# Patient Record
Sex: Female | Born: 1980 | Race: Black or African American | Hispanic: No | Smoking: Former smoker
Health system: Southern US, Community
[De-identification: ages and names within clinical notes are randomized; demographics above are authoritative.]

## PROBLEM LIST (undated history)

## (undated) DIAGNOSIS — Z8709 Personal history of other diseases of the respiratory system: Secondary | ICD-10-CM

## (undated) DIAGNOSIS — J45909 Unspecified asthma, uncomplicated: Secondary | ICD-10-CM

## (undated) DIAGNOSIS — Z8669 Personal history of other diseases of the nervous system and sense organs: Secondary | ICD-10-CM

## (undated) DIAGNOSIS — R569 Unspecified convulsions: Secondary | ICD-10-CM

## (undated) DIAGNOSIS — Z87442 Personal history of urinary calculi: Secondary | ICD-10-CM

## (undated) DIAGNOSIS — D259 Leiomyoma of uterus, unspecified: Secondary | ICD-10-CM

## (undated) DIAGNOSIS — N200 Calculus of kidney: Secondary | ICD-10-CM

## (undated) DIAGNOSIS — K5900 Constipation, unspecified: Secondary | ICD-10-CM

## (undated) HISTORY — PX: CHOLECYSTECTOMY: SHX55

---
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
## 2002-03-10 HISTORY — PX: CHOLECYSTECTOMY: SHX55

## 2003-11-29 ENCOUNTER — Emergency Department (HOSPITAL_COMMUNITY): Admission: EM | Admit: 2003-11-29 | Discharge: 2003-11-29 | Payer: Self-pay | Admitting: Emergency Medicine

## 2006-10-08 ENCOUNTER — Ambulatory Visit (HOSPITAL_COMMUNITY): Admission: RE | Admit: 2006-10-08 | Discharge: 2006-10-08 | Payer: Self-pay | Admitting: Family Medicine

## 2006-10-22 ENCOUNTER — Ambulatory Visit (HOSPITAL_COMMUNITY): Admission: RE | Admit: 2006-10-22 | Discharge: 2006-10-22 | Payer: Self-pay | Admitting: Family Medicine

## 2007-01-26 ENCOUNTER — Inpatient Hospital Stay (HOSPITAL_COMMUNITY): Admission: AD | Admit: 2007-01-26 | Discharge: 2007-01-26 | Payer: Self-pay | Admitting: Gynecology

## 2008-10-28 IMAGING — US US FETAL BPP W/O NONSTRESS
1 series · 14 of 25 positions shown · non-contrast
Comparison: none

OBSTETRICAL ULTRASOUND:

 This ultrasound exam was performed in the [HOSPITAL] Ultrasound Department.  The OB US report was generated in the AS system, and faxed to the ordering physician.  This report is also available in [REDACTED] PACS.

[Series 1: us fetal bpp w/o nonstress · 0.35mm/px · 25 acquisitions, 14 frames shown]
[im 1/25]
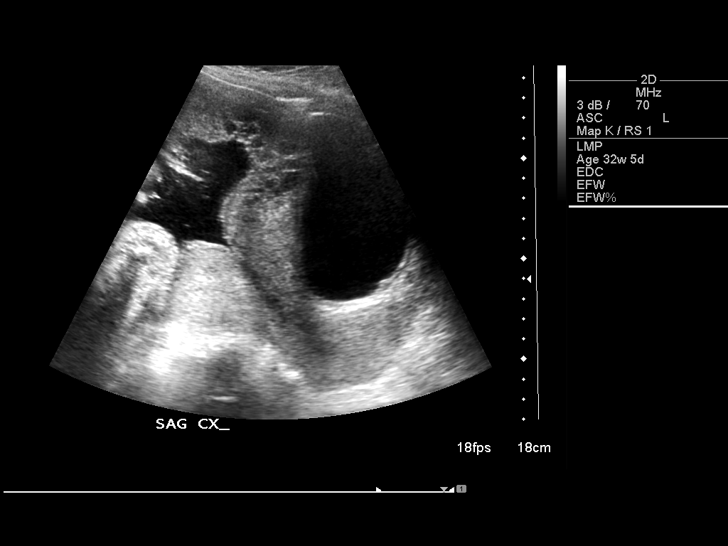
[im 3/25]
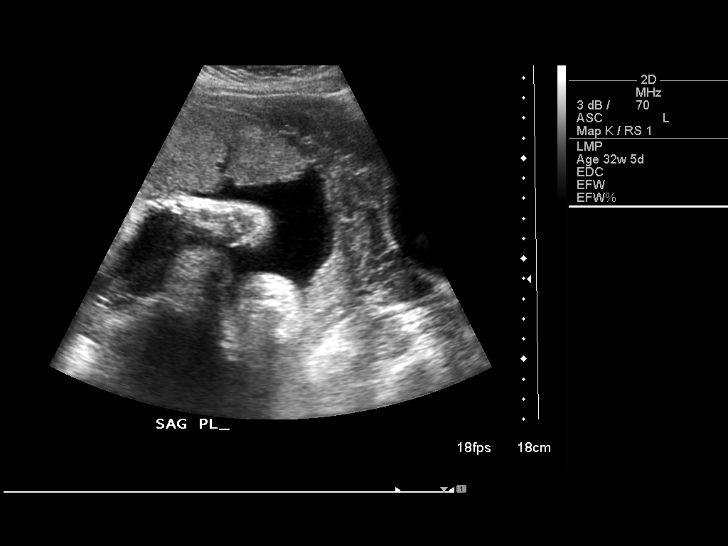
[im 5/25]
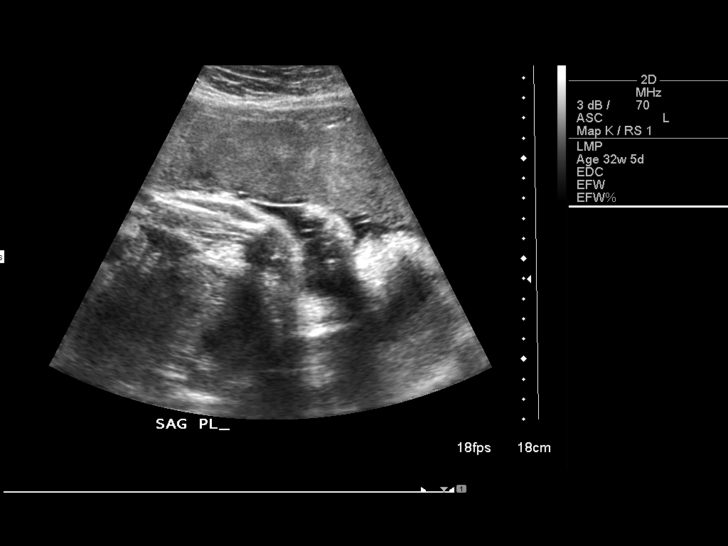
[im 7/25]
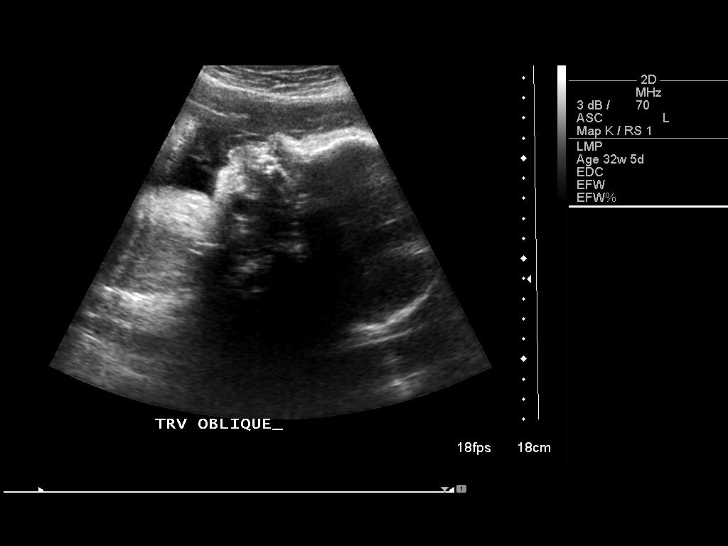
[im 9/25]
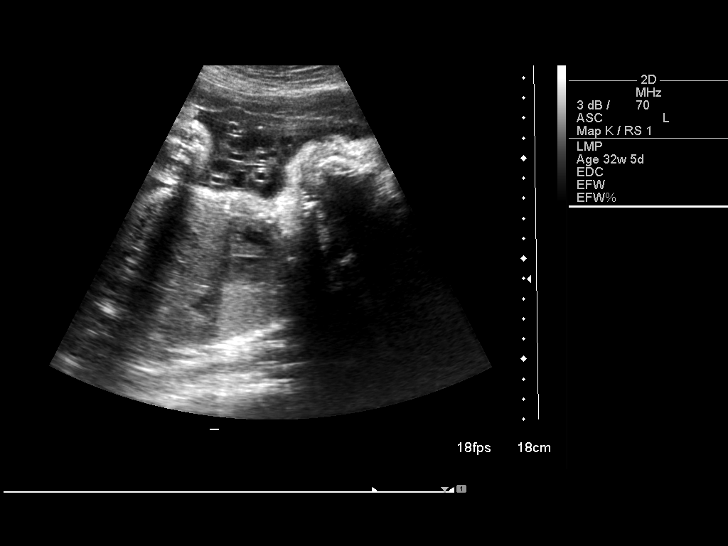
[im 10/25]
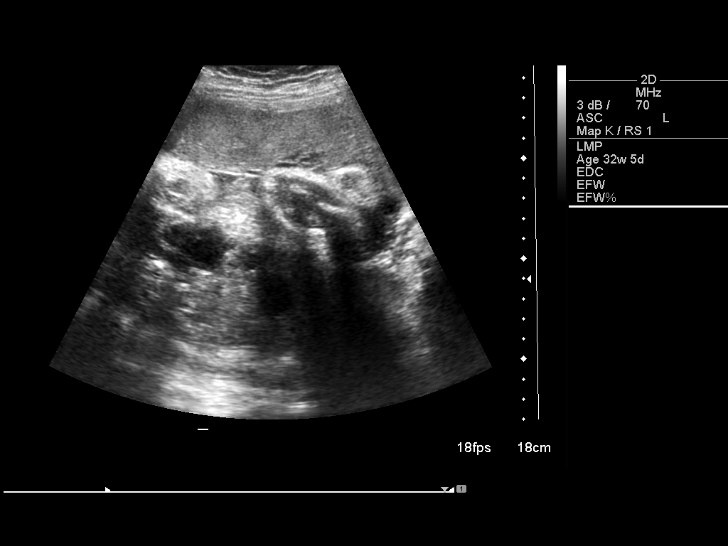
[im 12/25]
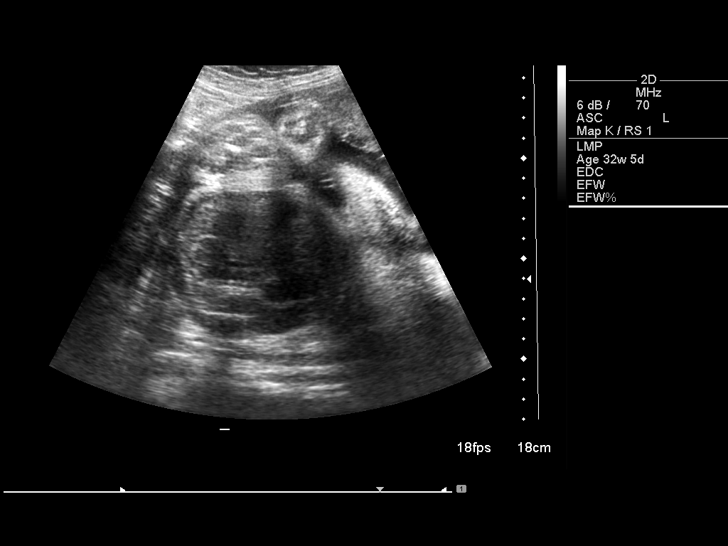
[im 14/25]
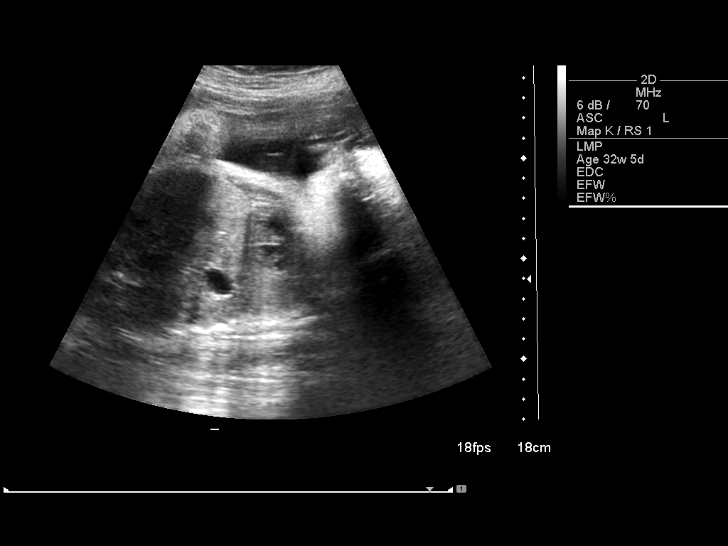
[im 16/25]
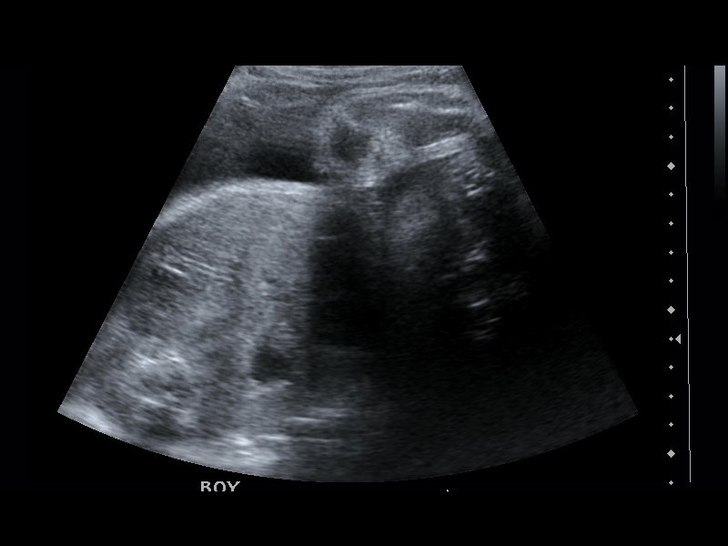
[im 17/25]
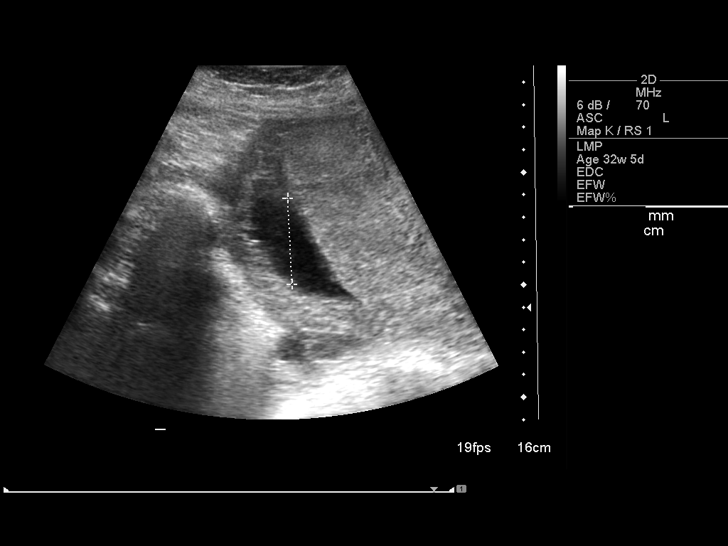
[im 19/25]
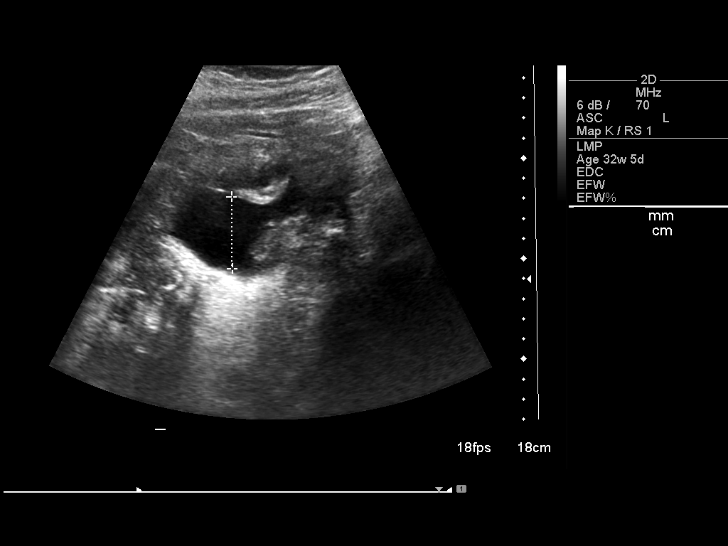
[im 21/25]
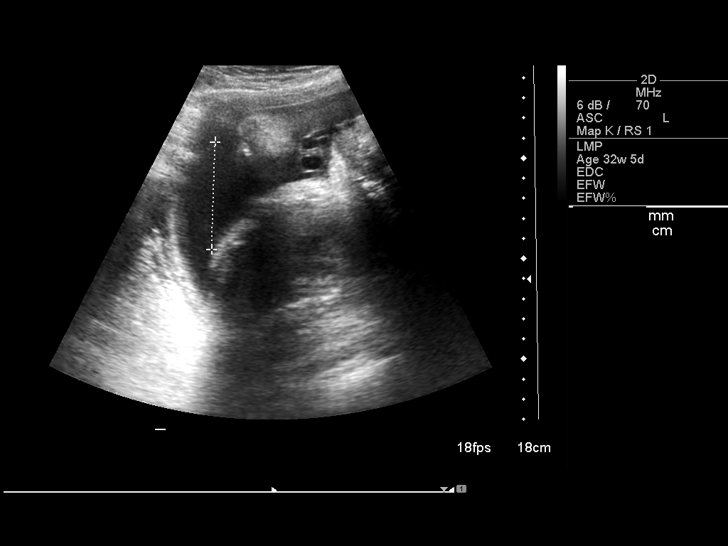
[im 23/25]
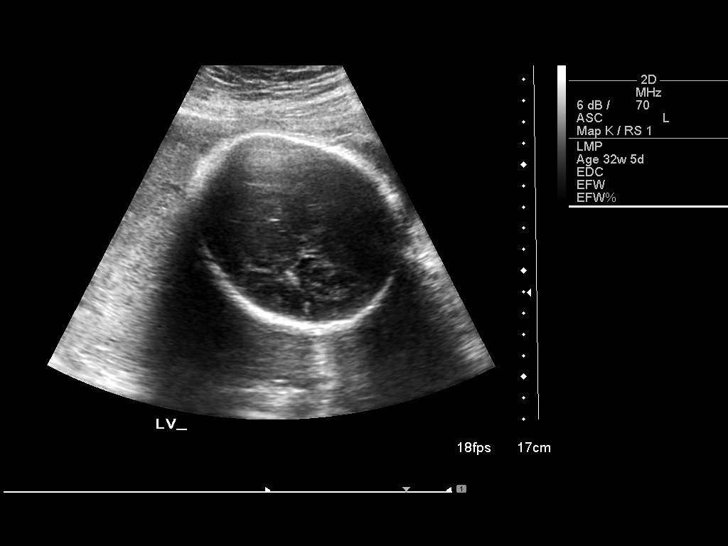
[im 25/25]
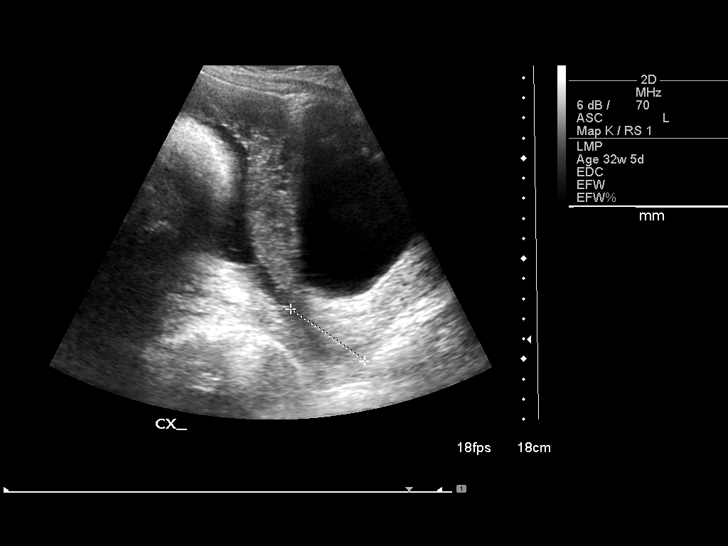

[14 of 25 positions shown; findings below may reference images not displayed]

IMPRESSION: See AS Obstetric US report.

## 2009-07-02 ENCOUNTER — Emergency Department (HOSPITAL_BASED_OUTPATIENT_CLINIC_OR_DEPARTMENT_OTHER): Admission: EM | Admit: 2009-07-02 | Discharge: 2009-07-02 | Payer: Self-pay | Admitting: Emergency Medicine

## 2010-12-17 LAB — GC/CHLAMYDIA PROBE AMP, GENITAL
Chlamydia, DNA Probe: NEGATIVE
GC Probe Amp, Genital: NEGATIVE

## 2010-12-17 LAB — URINALYSIS, ROUTINE W REFLEX MICROSCOPIC
Bilirubin Urine: NEGATIVE
Hgb urine dipstick: NEGATIVE
Ketones, ur: NEGATIVE
Nitrite: NEGATIVE
Protein, ur: NEGATIVE

## 2011-07-18 ENCOUNTER — Inpatient Hospital Stay (HOSPITAL_BASED_OUTPATIENT_CLINIC_OR_DEPARTMENT_OTHER)
Admission: EM | Admit: 2011-07-18 | Discharge: 2011-07-19 | Disposition: A | Discharge status: Home / Self Care | Payer: Self-pay | Attending: Obstetrics and Gynecology | Admitting: Obstetrics and Gynecology

## 2011-07-18 ENCOUNTER — Encounter (HOSPITAL_BASED_OUTPATIENT_CLINIC_OR_DEPARTMENT_OTHER): Payer: Self-pay | Admitting: *Deleted

## 2011-07-18 DIAGNOSIS — R102 Pelvic and perineal pain: Secondary | ICD-10-CM

## 2011-07-18 DIAGNOSIS — N949 Unspecified condition associated with female genital organs and menstrual cycle: Secondary | ICD-10-CM | POA: Insufficient documentation

## 2011-07-18 DIAGNOSIS — R109 Unspecified abdominal pain: Secondary | ICD-10-CM | POA: Insufficient documentation

## 2011-07-18 HISTORY — DX: Calculus of kidney: N20.0

## 2011-07-18 HISTORY — DX: Unspecified convulsions: R56.9

## 2011-07-18 LAB — URINALYSIS, ROUTINE W REFLEX MICROSCOPIC
Bilirubin Urine: NEGATIVE
Glucose, UA: NEGATIVE mg/dL
Hgb urine dipstick: NEGATIVE
Nitrite: NEGATIVE
Urobilinogen, UA: 0.2 mg/dL (ref 0.0–1.0)
pH: 6 (ref 5.0–8.0)

## 2011-07-18 LAB — PREGNANCY, URINE: Preg Test, Ur: NEGATIVE

## 2011-07-18 MED ORDER — KETOROLAC TROMETHAMINE 30 MG/ML IJ SOLN
30.0000 mg | Freq: Once | INTRAMUSCULAR | Status: AC
  Administered 2011-07-18: 30 mg via INTRAVENOUS
  Filled 2011-07-18: qty 1

## 2011-07-18 MED ORDER — MORPHINE SULFATE 4 MG/ML IJ SOLN
4.0000 mg | Freq: Once | INTRAMUSCULAR | Status: AC
  Administered 2011-07-18: 4 mg via INTRAVENOUS
  Filled 2011-07-18: qty 1

## 2011-07-18 MED ORDER — ONDANSETRON HCL 4 MG/2ML IJ SOLN
4.0000 mg | Freq: Once | INTRAMUSCULAR | Status: AC
  Administered 2011-07-18: 4 mg via INTRAVENOUS
  Filled 2011-07-18: qty 2

## 2011-07-18 NOTE — ED Notes (Signed)
Pt reports having abdominal pain in the LLQ. States that she thinks she may have a kidney stone since she has had them in the past and "it feels the same". Pt denies nausea and vomiting with the abdominal pain. Denies burning on urination or urinary frequency. Denies vaginal discharge or vaginal odor.

## 2011-07-18 NOTE — ED Notes (Signed)
Patient having left lower abd pain. Describes as sharp starting around noon today. Nauseas as well.

## 2011-07-19 ENCOUNTER — Encounter (HOSPITAL_COMMUNITY): Payer: Self-pay | Admitting: *Deleted

## 2011-07-19 ENCOUNTER — Inpatient Hospital Stay (HOSPITAL_COMMUNITY): Payer: Self-pay

## 2011-07-19 DIAGNOSIS — N949 Unspecified condition associated with female genital organs and menstrual cycle: Secondary | ICD-10-CM

## 2011-07-19 LAB — WET PREP, GENITAL: Trich, Wet Prep: NONE SEEN

## 2011-07-19 MED ORDER — ONDANSETRON HCL 4 MG/2ML IJ SOLN
4.0000 mg | Freq: Once | INTRAMUSCULAR | Status: AC
  Administered 2011-07-19: 4 mg via INTRAVENOUS
  Filled 2011-07-19: qty 2

## 2011-07-19 MED ORDER — MORPHINE SULFATE 4 MG/ML IJ SOLN
4.0000 mg | Freq: Once | INTRAMUSCULAR | Status: AC
  Administered 2011-07-19: 4 mg via INTRAVENOUS
  Filled 2011-07-19: qty 1

## 2011-07-19 MED ORDER — MORPHINE SULFATE 4 MG/ML IJ SOLN
INTRAMUSCULAR | Status: AC
  Filled 2011-07-19: qty 1

## 2011-07-19 MED ORDER — PROMETHAZINE HCL 25 MG PO TABS: 25.0000 mg | ORAL_TABLET | Freq: Four times a day (QID) | ORAL | Status: DC | PRN

## 2011-07-19 MED ORDER — OXYCODONE-ACETAMINOPHEN 5-325 MG PO TABS: 1.0000 | ORAL_TABLET | ORAL | Status: AC | PRN

## 2011-07-19 MED ORDER — MORPHINE SULFATE 4 MG/ML IJ SOLN
4.0000 mg | Freq: Once | INTRAMUSCULAR | Status: AC
  Administered 2011-07-19: 4 mg via INTRAVENOUS

## 2011-07-19 NOTE — ED Provider Notes (Signed)
  History     CSN: [id number]  Arrival date and time: 07/18/11 2219   First Provider Initiated Contact with Patient 07/19/11 0210      Chief Complaint  Patient presents with  . Abdominal Pain   HPI 31 year old gravida 3 para 3003 LMP 05/23/2011 using vasectomy as birth control who experienced acute onset left lower quadrant pain 1-1/2 hour after exercise that was considered high impact exercise. Patient is referred here the [place] [facility] for ultrasound to complete workup physical exam positive for left lower quadrant tenderness in the abdominal wall that is noticeable even when the patient is tightening the abdominal wall therefore not allowing deep palpation. Normal bowel function and normal activity other than the pain which has required IV Toradol and morphine x2 history no bowel function changes in positions voiding well. History of kidney stones noted urinalysis negative earlier tonight Pertinent Gynecological History: Menses: flow is moderate Bleeding: Regular monthly Contraception: vasectomy DES exposure: denies Blood transfusions: none Sexually transmitted diseases: no past history Previous GYN Procedures: None  Last mammogram:  Date:  Last pap:  Date:    Past Medical History  Diagnosis Date  . Seizures   . Kidney stone     Past Surgical History  Procedure Date  . Cholecystectomy     Family History  Problem Relation Age of Onset  . Anesthesia problems Neg Hx   . Hypotension Neg Hx   . Malignant hyperthermia Neg Hx   . Pseudochol deficiency Neg Hx     History  Substance Use Topics  . Smoking status: Never Smoker   . Smokeless tobacco: Not on file  . Alcohol Use: No    Allergies: No Known Allergies   (Not in a hospital admission)  ROS Physical Exam   Blood pressure 121/80, pulse 79, temperature 98.1 F (36.7 C), temperature source Oral, resp. rate 16, last menstrual period 06/23/2011, SpO2 100.00%.  Physical ExamPhysical  Examination: General appearance - alert, well appearing, and in no distress and in mild to moderate distress Chest - no tachypnea, retractions or cyanosis Abdomen - soft, nontender, nondistended, no masses or organomegaly tenderness noted right lower quadrant just above anterior superior iliac crest, noticeable on palpation by face even abdominal muscles tight. No bulge no identifiable hernia Pelvic - VULVA: normal appearing vulva with no masses, tenderness or lesions, pelvic ultrasound ordered  MAU Course  Procedures pelvic ultrasound was ordered and reviewed. Both ovaries are low behind the uterus near the cul-de-sac. There is blood flow in both ovaries the transvaginal ultrasound was moderately painful similar to the discomfort it brought her here impression ovulation pain without evidence of torsion  MDM Exam and review of labs and ultrasound  Assessment and Plan  Mittelschmerz, ovulation pain without evidence of torsion Plan discharge home with analgesics Percocet and Phenergan anticipate spontaneous resolution within 72 hours .  Keawe Marcello V 07/19/2011, 2:17 AM

## 2011-07-19 NOTE — ED Provider Notes (Signed)
History     CSN: [id number]  Arrival date & time 07/18/11  2219   First MD Initiated Contact with Patient 07/18/11 2230      Chief Complaint  Patient presents with  . Abdominal Pain    (Consider location/radiation/quality/duration/timing/severity/associated sxs/prior treatment) HPI Comments: About 20 min after high impact training today she developed severe left groin pain that caused her to be dizzy and nauseated.  The pain went away and then came back and has been persistent.  States it does not feel like her kidney stone.  Patient is a 31 y.o. female presenting with abdominal pain. The history is provided by the patient.  Abdominal Pain The primary symptoms of the illness include abdominal pain and nausea. The primary symptoms of the illness do not include fever, shortness of breath, vomiting, diarrhea, dysuria, vaginal discharge or vaginal bleeding. The current episode started 6 to 12 hours ago. The onset of the illness was sudden. Progression since onset: waxing and waning.  Pain Location: left groin area. The abdominal pain does not radiate. The severity of the abdominal pain is 9/10. The abdominal pain is relieved by nothing. Exacerbated by: nothing.  Additional symptoms associated with the illness include anorexia. Symptoms associated with the illness do not include constipation, urgency, frequency or back pain. Associated medical issues comments: kidney stones.    Past Medical History  Diagnosis Date  . Seizures     Past Surgical History  Procedure Date  . Cholecystectomy     No family history on file.  History  Substance Use Topics  . Smoking status: Never Smoker   . Smokeless tobacco: Not on file  . Alcohol Use: No    OB History    Grav Para Term Preterm Abortions TAB SAB Ect Mult Living                  Review of Systems  Constitutional: Negative for fever.  Respiratory: Negative for shortness of breath.   Gastrointestinal: Positive for nausea,  abdominal pain and anorexia. Negative for vomiting, diarrhea and constipation.  Genitourinary: Negative for dysuria, urgency, frequency, vaginal bleeding and vaginal discharge.  Musculoskeletal: Negative for back pain.  All other systems reviewed and are negative.    Allergies  Review of patient's allergies indicates no known allergies.  Home Medications   Current Outpatient Rx  Name Route Sig Dispense Refill  . IBUPROFEN 800 MG PO TABS Oral Take 800 mg by mouth every 8 (eight) hours as needed. Patient used this medication for tooth pain.    Marland Kitchen PRESCRIPTION MEDICATION Oral Take 1 tablet by mouth daily as needed. Patient used this medication for pain.      BP 127/77  Pulse 65  Temp(Src) 98.3 F (36.8 C) (Oral)  Resp 20  SpO2 100%  LMP 06/23/2011  Physical Exam  Nursing note and vitals reviewed. Constitutional: She is oriented to person, place, and time. She appears well-developed and well-nourished. She appears distressed.  HENT:  Head: Normocephalic and atraumatic.  Mouth/Throat: Oropharynx is clear and moist.  Eyes: EOM are normal. Pupils are equal, round, and reactive to light.  Cardiovascular: Normal rate, regular rhythm, normal heart sounds and intact distal pulses.  Exam reveals no friction rub.   No murmur heard. Pulmonary/Chest: Effort normal and breath sounds normal. She has no wheezes. She has no rales.  Abdominal: Soft. Bowel sounds are normal. She exhibits no distension. There is tenderness. There is no rebound, no guarding and no CVA tenderness.    Genitourinary:  Vagina normal and uterus normal. Cervix exhibits no motion tenderness, no discharge and no friability. Right adnexum displays no mass, no tenderness and no fullness. Left adnexum displays tenderness. Left adnexum displays no mass.  Musculoskeletal: Normal range of motion. She exhibits no tenderness.       No edema  Neurological: She is alert and oriented to person, place, and time. No cranial nerve  deficit.  Skin: Skin is warm and dry. No rash noted.  Psychiatric: She has a normal mood and affect. Her behavior is normal.    ED Course  Procedures (including critical care time)   Labs Reviewed  PREGNANCY, URINE  URINALYSIS, ROUTINE W REFLEX MICROSCOPIC  WET PREP, GENITAL  GC/CHLAMYDIA PROBE AMP, GENITAL   No results found.   1. Pelvic pain       MDM   Pt with symptoms concerning for ovarian torsion with abrupt onset of pain today after high impact activity.  The pain has now been persistent for 4-5 hours and worsening.  Pain over the left ovary.  No pain with urination and no radiation to the back like her prior kidney stones.  Pt has not blood in the urine suggestive of stone and not pregnant. [facility]        Gwyneth Sprout, MD 07/19/11 [phone]

## 2011-07-19 NOTE — MAU Note (Addendum)
Pt to Rm #5 via [facility] stretcher. Alert and oriented. IV NS patent and infusing 125/hr R AC. Dr Emelda Fear called and will see pt. Pt came from [facility].

## 2011-07-19 NOTE — ED Notes (Signed)
Nursing report given to Christine, RN.

## 2011-07-19 NOTE — Progress Notes (Signed)
Written and verbal d/c instructions given and understanding voiced. 

## 2011-07-19 NOTE — Progress Notes (Signed)
MD notified pt requesting med for nausea. Will put in order for Zofran.

## 2011-07-21 LAB — GC/CHLAMYDIA PROBE AMP, GENITAL: Chlamydia, DNA Probe: NEGATIVE

## 2014-01-09 ENCOUNTER — Encounter (HOSPITAL_COMMUNITY): Payer: Self-pay | Admitting: *Deleted

## 2016-12-05 ENCOUNTER — Emergency Department (HOSPITAL_BASED_OUTPATIENT_CLINIC_OR_DEPARTMENT_OTHER)
Admission: EM | Admit: 2016-12-05 | Discharge: 2016-12-05 | Disposition: A | Discharge status: Home / Self Care | Payer: Self-pay | Attending: Emergency Medicine | Admitting: Emergency Medicine

## 2016-12-05 ENCOUNTER — Emergency Department (HOSPITAL_BASED_OUTPATIENT_CLINIC_OR_DEPARTMENT_OTHER): Payer: Self-pay

## 2016-12-05 ENCOUNTER — Encounter (HOSPITAL_BASED_OUTPATIENT_CLINIC_OR_DEPARTMENT_OTHER): Payer: Self-pay | Admitting: Emergency Medicine

## 2016-12-05 DIAGNOSIS — Z79899 Other long term (current) drug therapy: Secondary | ICD-10-CM | POA: Insufficient documentation

## 2016-12-05 DIAGNOSIS — R079 Chest pain, unspecified: Secondary | ICD-10-CM

## 2016-12-05 DIAGNOSIS — R0789 Other chest pain: Secondary | ICD-10-CM | POA: Insufficient documentation

## 2016-12-05 DIAGNOSIS — M542 Cervicalgia: Secondary | ICD-10-CM

## 2016-12-05 DIAGNOSIS — J45909 Unspecified asthma, uncomplicated: Secondary | ICD-10-CM | POA: Insufficient documentation

## 2016-12-05 HISTORY — DX: Unspecified asthma, uncomplicated: J45.909

## 2016-12-05 LAB — BASIC METABOLIC PANEL
ANION GAP: 7 (ref 5–15)
BUN: 8 mg/dL (ref 6–20)
CALCIUM: 9 mg/dL (ref 8.9–10.3)
CO2: 24 mmol/L (ref 22–32)
Chloride: 105 mmol/L (ref 101–111)
Creatinine, Ser: 0.71 mg/dL (ref 0.44–1.00)
Glucose, Bld: 106 mg/dL — ABNORMAL HIGH (ref 65–99)
POTASSIUM: 3.5 mmol/L (ref 3.5–5.1)
Sodium: 136 mmol/L (ref 135–145)

## 2016-12-05 LAB — CBC
HCT: 36 % (ref 36.0–46.0)
Hemoglobin: 12.4 g/dL (ref 12.0–15.0)
MCH: 23 pg — AB (ref 26.0–34.0)
MCHC: 34.4 g/dL (ref 30.0–36.0)
MCV: 66.9 fL — ABNORMAL LOW (ref 78.0–100.0)
Platelets: 283 10*3/uL (ref 150–400)
RBC: 5.38 MIL/uL — AB (ref 3.87–5.11)
RDW: 16.1 % — ABNORMAL HIGH (ref 11.5–15.5)
WBC: 8.1 10*3/uL (ref 4.0–10.5)

## 2016-12-05 LAB — TROPONIN I: Troponin I: <0.03 ng/mL (ref <0.03)

## 2016-12-05 MED ORDER — GABAPENTIN 100 MG PO CAPS: 100.0000 mg | ORAL_CAPSULE | Freq: Three times a day (TID) | ORAL | 0 refills | Status: DC

## 2016-12-05 NOTE — ED Provider Notes (Signed)
[facility] DEPT [facility] Provider Note   CSN: [id number] Arrival date & time: 12/05/16  1345     History   Chief Complaint Chief Complaint  Patient presents with  . Chest Pain    HPI Amber Mcguire is a 36 y.o. female.  HPI                                                         Patient with a past history significant for seizures as child presents with neck, chest, arm tingling. She states that this has been going on for about a year. She states that over the last 3 months it has been constant. She denies any shortness of breath associated with this pain any nausea vomiting or diaphoresis. She states it is worse when she cries, not associated with activity. Nothing seems to improve the pain. She states that she has numbness and tingling in her arm and radiates down through her neck to her arm. She states that this is worse in the afternoons and at night and not present in the mornings. She is a bus Geophysicist/field seismologist. She states this all started after patient found out her husband was cheating on her about a year ago. Denies any physical traumatic event. And she feels like there is some component of anxiety and depression related to it. She also notes at times having fluttering in her chest when she takes a deep breath. This is not always present, and happens every once in a while.  Past Medical History:  Diagnosis Date  . Asthma   . Kidney stone   . Seizures ([place])     There are no active problems to display for this patient.   Past Surgical History:  Procedure Laterality Date  . CHOLECYSTECTOMY      OB History    Gravida Para Term Preterm AB Living   3 3 3  0 0 3   SAB TAB Ectopic Multiple Live Births   0 0 0 0         Home Medications    Prior to Admission medications   Medication Sig Start Date End Date Taking? Authorizing Provider  ibuprofen (ADVIL,MOTRIN) 800 MG tablet Take 800 mg by mouth every 8 (eight) hours as needed. Patient  used this medication for tooth pain.    [provider]  promethazine (PHENERGAN) 25 MG tablet Take 1 tablet (25 mg total) by mouth every 6 (six) hours as needed for nausea. 07/19/11 07/26/11  Jonnie Kind, MD    Family History Family History  Problem Relation Age of Onset  . Anesthesia problems Neg Hx   . Hypotension Neg Hx   . Malignant hyperthermia Neg Hx   . Pseudochol deficiency Neg Hx     Social History Social History  Substance Use Topics  . Smoking status: Never Smoker  . Smokeless tobacco: Never Used  . Alcohol use No     Allergies   Patient has no known allergies.   Review of Systems Review of Systems   Physical Exam Updated Vital Signs BP (!) 135/96   Pulse 84   Temp 98.3 F (36.8 C) (Oral)   Resp 15   Ht 5' (1.524 m)   Wt 99.8 kg (220 lb)   LMP 12/01/2016   SpO2 99%   BMI 42.97 kg/m  Physical Exam   ED Treatments / Results  Labs (all labs ordered are listed, but only abnormal results are displayed) Labs Reviewed  BASIC METABOLIC PANEL - Abnormal; Notable for the following:       Result Value   Glucose, Bld 106 (*)    All other components within normal limits  CBC - Abnormal; Notable for the following:    RBC 5.38 (*)    MCV 66.9 (*)    MCH 23.0 (*)    RDW 16.1 (*)    All other components within normal limits  TROPONIN I    EKG  EKG Interpretation  Date/Time:  Friday December 05 2016 13:55:47 EDT Ventricular Rate:  97 PR Interval:  166 QRS Duration: 80 QT Interval:  338 QTC Calculation: 429 R Axis:   23 Text Interpretation:  Normal sinus rhythm Possible Left atrial enlargement Borderline ECG Confirmed by Brantley Stage [phone]), editor Philomena Doheny [phone]) on 12/05/2016 2:15:51 PM       Radiology Dg Chest 2 View  Result Date: 12/05/2016 CLINICAL DATA:  Chest pain EXAM: CHEST  2 VIEW COMPARISON:  None. FINDINGS: Lungs are clear. Heart size and pulmonary vascularity are normal. No adenopathy. No pneumothorax. No bone  lesions. IMPRESSION: No edema or consolidation. Electronically Signed   By: Lowella Grip III M.D.   On: 12/05/2016 14:23    Procedures Procedures (including critical care time)  Medications Ordered in ED Medications - No data to display   Initial Impression / Assessment and Plan / ED Course  I have reviewed the triage vital signs and the nursing notes.  Pertinent labs & imaging results that were available during my care of the patient were reviewed by me and considered in my medical decision making (see chart for details).   Patient presenting with atypical chest pain. No shortness of breath, no tachycardia and no leg swelling, no tachycardia,no  history of clot, therefore unlikely PE. Given that patient has had this for over a year, not consistent with ACS. EKG without any significant findings for ACS, first troponin negative. Given symptoms of numbness and tingling in arm and neck most likely consistent with a cervical radiculopathy. Will prescribe patient with gabapentin, can use ibuprofen for pain. [facility] with [facility]   Final Clinical Impressions(s) / ED Diagnoses   Final diagnoses:  None    New Prescriptions New Prescriptions   No medications on file     Tonette Bihari, MD 12/05/16 [place], Wenda Overland, MD 12/06/16 1159

## 2016-12-05 NOTE — Discharge Instructions (Signed)
I believe you're having radicular pain caused by possible pinched nerve. This often takes quite a while to resolve. You can take ibuprofen as needed for the pain. I will prescribe you some gabapentin for possible neuropathic pain, least try when you're off of work first to make sure that you don't have any drowsiness associated with this medication. I want to follow-up with the [facility] as appropriate pressures elevated and you likely need medication for this.

## 2016-12-05 NOTE — ED Triage Notes (Signed)
L side chest tightness x 1 week. Reports this has happened intermittently over the past 8 months and she thought it was related to stress. Tightness has been constant for the past week.

## 2016-12-05 NOTE — ED Notes (Signed)
ED Provider at bedside. 

## 2016-12-05 NOTE — ED Notes (Signed)
Pt on auto VS  

## 2016-12-15 NOTE — ED Provider Notes (Signed)
[facility] MED RESIDENT Provider Note   CSN: [id number] Arrival date & time: 12/05/16  1345     History   Chief Complaint Chief Complaint  Patient presents with  . Chest Pain    HPI Amber Mcguire is a 36 y.o. female.  HPI                                                         Patient with a past history significant for seizures as child presents with neck, chest, arm tingling. She states that this has been going on for about a year. She states that over the last 3 months it has been constant. She denies any shortness of breath associated with this pain any nausea vomiting or diaphoresis. She states it is worse when she cries, not associated with activity. Nothing seems to improve the pain. She states that she has numbness and tingling in her arm and radiates down through her neck to her arm. She states that this is worse in the afternoons and at night and not present in the mornings. She is a bus Geophysicist/field seismologist. She states this all started after patient found out her husband was cheating on her about a year ago. Denies any physical traumatic event. And she feels like there is some component of anxiety and depression related to it. She also notes at times having fluttering in her chest when she takes a deep breath. This is not always present, and happens every once in a while.  Past Medical History:  Diagnosis Date  . Asthma   . Kidney stone   . Seizures ([place])     There are no active problems to display for this patient.   Past Surgical History:  Procedure Laterality Date  . CHOLECYSTECTOMY      OB History    Gravida Para Term Preterm AB Living   3 3 3  0 0 3   SAB TAB Ectopic Multiple Live Births   0 0 0 0         Home Medications    Prior to Admission medications   Medication Sig Start Date End Date Taking? Authorizing Provider  gabapentin (NEURONTIN) 100 MG capsule Take 1 capsule (100 mg total) by mouth 3 (three) times daily.  12/05/16 12/05/17  Tonette Bihari, MD  ibuprofen (ADVIL,MOTRIN) 800 MG tablet Take 800 mg by mouth every 8 (eight) hours as needed. Patient used this medication for tooth pain.    [provider]  promethazine (PHENERGAN) 25 MG tablet Take 1 tablet (25 mg total) by mouth every 6 (six) hours as needed for nausea. 07/19/11 07/26/11  Jonnie Kind, MD    Family History Family History  Problem Relation Age of Onset  . Anesthesia problems Neg Hx   . Hypotension Neg Hx   . Malignant hyperthermia Neg Hx   . Pseudochol deficiency Neg Hx     Social History Social History  Substance Use Topics  . Smoking status: Never Smoker  . Smokeless tobacco: Never Used  . Alcohol use No     Allergies   Patient has no known allergies.   Review of Systems Review of Systems  All other systems reviewed and are negative.    Physical Exam Updated Vital Signs BP (!) 135/96   Pulse 84   Temp 98.3 F (  36.8 C) (Oral)   Resp 15   Ht 5' (1.524 m)   Wt 220 lb (99.8 kg)   LMP 12/01/2016   SpO2 99%   BMI 42.97 kg/m   Physical Exam  Constitutional: She is oriented to person, place, and time. She appears well-developed and well-nourished.  HENT:  Head: Normocephalic and atraumatic.  Eyes: Pupils are equal, round, and reactive to light. Conjunctivae are normal.  Neck:  Paraspinal muscle tenderness   Cardiovascular: Normal rate, regular rhythm and normal heart sounds.   Pulmonary/Chest: Effort normal and breath sounds normal.  Abdominal: Soft. Bowel sounds are normal.  Musculoskeletal:  No abnormalities noted on inspection,pain with palpation of paraspinal muscle along the neck, muscle tenderness in the should,  normal ROM, 5/5 strength bilateral extremities   Neurological: She is alert and oriented to person, place, and time.  Skin: Skin is warm and dry.  Psychiatric: She has a normal mood and affect.     ED Treatments / Results  Labs (all labs ordered are listed, but only  abnormal results are displayed) Labs Reviewed  BASIC METABOLIC PANEL - Abnormal; Notable for the following:       Result Value   Glucose, Bld 106 (*)    All other components within normal limits  CBC - Abnormal; Notable for the following:    RBC 5.38 (*)    MCV 66.9 (*)    MCH 23.0 (*)    RDW 16.1 (*)    All other components within normal limits  TROPONIN I    EKG  EKG Interpretation None       Radiology No results found.  Procedures Procedures (including critical care time)  Medications Ordered in ED Medications - No data to display   Initial Impression / Assessment and Plan / ED Course  I have reviewed the triage vital signs and the nursing notes.  Pertinent labs & imaging results that were available during my care of the patient were reviewed by me and considered in my medical decision making (see chart for details).   Patient presenting with atypical chest pain. No shortness of breath, no tachycardia and no leg swelling, no tachycardia,no  history of clot, therefore unlikely PE. Given that patient has had this for over a year, not consistent with ACS. EKG without any significant findings for ACS, first troponin negative. Given symptoms of numbness and tingling in arm and neck most likely consistent with a cervical radiculopathy. Will prescribe patient with gabapentin, can use ibuprofen for pain. [facility] with [facility].   Final Clinical Impressions(s) / ED Diagnoses   Final diagnoses:  Chest pain, unspecified type  Neck pain    New Prescriptions Discharge Medication List as of 12/05/2016  4:49 PM    START taking these medications   Details  gabapentin (NEURONTIN) 100 MG capsule Take 1 capsule (100 mg total) by mouth 3 (three) times daily., Starting Fri 12/05/2016, Until Sat 12/05/2017, Print         Rosi Secrist, Jeani Sow, MD 12/05/16 1649     [place], Wenda Overland, MD 12/18/16 0003

## 2018-03-14 ENCOUNTER — Emergency Department (HOSPITAL_BASED_OUTPATIENT_CLINIC_OR_DEPARTMENT_OTHER): Payer: Self-pay

## 2018-03-14 ENCOUNTER — Encounter (HOSPITAL_BASED_OUTPATIENT_CLINIC_OR_DEPARTMENT_OTHER): Payer: Self-pay | Admitting: Emergency Medicine

## 2018-03-14 ENCOUNTER — Other Ambulatory Visit: Payer: Self-pay

## 2018-03-14 ENCOUNTER — Emergency Department (HOSPITAL_BASED_OUTPATIENT_CLINIC_OR_DEPARTMENT_OTHER)
Admission: EM | Admit: 2018-03-14 | Discharge: 2018-03-14 | Disposition: A | Discharge status: Home / Self Care | Payer: Self-pay | Attending: Emergency Medicine | Admitting: Emergency Medicine

## 2018-03-14 DIAGNOSIS — J45909 Unspecified asthma, uncomplicated: Secondary | ICD-10-CM | POA: Insufficient documentation

## 2018-03-14 DIAGNOSIS — R109 Unspecified abdominal pain: Secondary | ICD-10-CM | POA: Insufficient documentation

## 2018-03-14 DIAGNOSIS — Z87442 Personal history of urinary calculi: Secondary | ICD-10-CM | POA: Insufficient documentation

## 2018-03-14 DIAGNOSIS — F17200 Nicotine dependence, unspecified, uncomplicated: Secondary | ICD-10-CM | POA: Insufficient documentation

## 2018-03-14 DIAGNOSIS — R300 Dysuria: Secondary | ICD-10-CM | POA: Insufficient documentation

## 2018-03-14 LAB — URINALYSIS, ROUTINE W REFLEX MICROSCOPIC
BILIRUBIN URINE: NEGATIVE
GLUCOSE, UA: NEGATIVE mg/dL
HGB URINE DIPSTICK: NEGATIVE
KETONES UR: NEGATIVE mg/dL
Nitrite: NEGATIVE
PH: 6 (ref 5.0–8.0)
Protein, ur: NEGATIVE mg/dL
Specific Gravity, Urine: 1.02 (ref 1.005–1.030)

## 2018-03-14 LAB — URINALYSIS, MICROSCOPIC (REFLEX)

## 2018-03-14 LAB — PREGNANCY, URINE: Preg Test, Ur: NEGATIVE

## 2018-03-14 MED ORDER — DOXYCYCLINE HYCLATE 100 MG PO CAPS: 100.0000 mg | ORAL_CAPSULE | Freq: Two times a day (BID) | ORAL | 0 refills | Status: DC

## 2018-03-14 MED ORDER — ONDANSETRON 8 MG PO TBDP
8.0000 mg | ORAL_TABLET | Freq: Once | ORAL | Status: AC
  Administered 2018-03-14: 8 mg via ORAL
  Filled 2018-03-14: qty 1

## 2018-03-14 MED ORDER — DOXYCYCLINE HYCLATE 100 MG PO TABS
100.0000 mg | ORAL_TABLET | Freq: Once | ORAL | Status: AC
  Administered 2018-03-14: 100 mg via ORAL
  Filled 2018-03-14: qty 1

## 2018-03-14 MED ORDER — IBUPROFEN 400 MG PO TABS
400.0000 mg | ORAL_TABLET | Freq: Once | ORAL | Status: AC
  Administered 2018-03-14: 400 mg via ORAL
  Filled 2018-03-14: qty 1

## 2018-03-14 NOTE — ED Provider Notes (Signed)
[facility] EMERGENCY DEPARTMENT Provider Note   CSN: [id number] Arrival date & time: 03/14/18  0024     History   Chief Complaint Chief Complaint  Patient presents with  . Hematuria    HPI Amber Mcguire is a 38 y.o. female.  The history is provided by the patient.  Hematuria   She has history of asthma, seizures, kidney stones and comes in with urinary symptoms for the last 2 weeks.  She describes urinary urgency, frequency, tenesmus, dysuria.  There is also been pain in the left flank with radiation to the left lower abdomen.  She has had nausea but no vomiting.  She denies fever, chills, sweats.  She treated herself with some over-the-counter medications which have not given her any relief.  Pain is currently rated at 8/10.  Nothing makes it better, nothing makes it worse.  Past Medical History:  Diagnosis Date  . Asthma   . Kidney stone   . Seizures ([place])     There are no active problems to display for this patient.   Past Surgical History:  Procedure Laterality Date  . CHOLECYSTECTOMY       OB History    Gravida  3   Para  3   Term  3   Preterm  0   AB  0   Living  3     SAB  0   TAB  0   Ectopic  0   Multiple  0   Live Births               Home Medications    Prior to Admission medications   Medication Sig Start Date End Date Taking? Authorizing Provider  gabapentin (NEURONTIN) 100 MG capsule Take 1 capsule (100 mg total) by mouth 3 (three) times daily. 12/05/16 12/05/17  Tonette Bihari, MD  ibuprofen (ADVIL,MOTRIN) 800 MG tablet Take 800 mg by mouth every 8 (eight) hours as needed. Patient used this medication for tooth pain.    [provider]  promethazine (PHENERGAN) 25 MG tablet Take 1 tablet (25 mg total) by mouth every 6 (six) hours as needed for nausea. 07/19/11 07/26/11  Jonnie Kind, MD    Family History Family History  Problem Relation Age of Onset  . Anesthesia problems Neg Hx   . Hypotension  Neg Hx   . Malignant hyperthermia Neg Hx   . Pseudochol deficiency Neg Hx     Social History Social History   Tobacco Use  . Smoking status: Current Some Day Smoker  . Smokeless tobacco: Never Used  Substance Use Topics  . Alcohol use: Yes    Comment: occasional  . Drug use: No     Allergies   Patient has no known allergies.   Review of Systems Review of Systems  Genitourinary: Positive for hematuria.  All other systems reviewed and are negative.    Physical Exam Updated Vital Signs BP (!) 154/100 (BP Location: Left Arm)   Pulse 68   Temp 98.1 F (36.7 C) (Oral)   Resp 18   Ht 5' (1.524 m)   Wt 93.9 kg   LMP 03/10/2018   SpO2 100%   BMI 40.43 kg/m   Physical Exam Vitals signs and nursing note reviewed.    38 year old female, resting comfortably and in no acute distress. Vital signs are significant for elevated blood pressure. Oxygen saturation is 100%, which is normal. Head is normocephalic and atraumatic. PERRLA, EOMI. Oropharynx is clear. Neck  is nontender and supple without adenopathy or JVD. Back is nontender in the midline.  There is mild to moderate left CVA tenderness. Lungs are clear without rales, wheezes, or rhonchi. Chest is nontender. Heart has regular rate and rhythm without murmur. Abdomen is soft, flat, with mild to moderate left lower quadrant tenderness.  There is no rebound or guarding.  There are no masses or hepatosplenomegaly and peristalsis is hypoactive. Extremities have no cyanosis or edema, full range of motion is present. Skin is warm and dry without rash. Neurologic: Mental status is normal, cranial nerves are intact, there are no motor or sensory deficits.  ED Treatments / Results  Labs (all labs ordered are listed, but only abnormal results are displayed) Labs Reviewed  URINALYSIS, ROUTINE W REFLEX MICROSCOPIC - Abnormal; Notable for the following components:      Result Value   Leukocytes, UA TRACE (*)    All other  components within normal limits  URINALYSIS, MICROSCOPIC (REFLEX) - Abnormal; Notable for the following components:   Bacteria, UA RARE (*)    All other components within normal limits  PREGNANCY, URINE   Radiology Ct Renal Stone Study  Result Date: 03/14/2018 CLINICAL DATA:  Initial evaluation for acute flank pain. EXAM: CT ABDOMEN AND PELVIS WITHOUT CONTRAST TECHNIQUE: Multidetector CT imaging of the abdomen and pelvis was performed following the standard protocol without IV contrast. COMPARISON:  None. FINDINGS: Lower chest: Visualized lung bases are clear. Hepatobiliary: Liver demonstrates a normal unenhanced appearance. Gallbladder surgically absent. No biliary dilatation. Pancreas: Pancreas within normal limits. Spleen: Spleen within normal limits. Adrenals/Urinary Tract: Adrenal glands are normal. Kidneys equal in size without evidence for nephrolithiasis or hydronephrosis. No radiopaque calculi seen along the course of either renal collecting system. No appreciable hydroureter. Few scattered renal cysts noted bilaterally. Partially distended bladder within normal limits. No layering stones within the bladder lumen. Stomach/Bowel: Tiny hiatal hernia noted. Stomach otherwise unremarkable. No evidence for bowel obstruction. Normal appendix. No acute inflammatory changes seen about the bowels. Vascular/Lymphatic: Intra-abdominal aorta of normal caliber. No adenopathy. Reproductive: Limited noncontrast evaluation of the uterus and ovaries within normal limits for age. Other: No free air or fluid. Musculoskeletal: No acute osseous abnormality. No discrete lytic or blastic osseous lesions. IMPRESSION: 1. No CT evidence for nephrolithiasis or obstructive uropathy. 2. No other acute intra-abdominal or pelvic process identified. Electronically Signed   By: Jeannine Boga M.D.   On: 03/14/2018 04:36    Procedures Procedures   Medications Ordered in ED Medications  ibuprofen (ADVIL,MOTRIN) tablet  400 mg (has no administration in time range)  ondansetron (ZOFRAN-ODT) disintegrating tablet 8 mg (8 mg Oral Given 03/14/18 0401)     Initial Impression / Assessment and Plan / ED Course  I have reviewed the triage vital signs and the nursing notes.  Pertinent labs & imaging results that were available during my care of the patient were reviewed by me and considered in my medical decision making (see chart for details).  Urinary symptoms and flank pain consistent with urinary tract infection.  However, urinalysis shows no evidence of infection.  Old records are reviewed, and she has no relevant past visits.  Will send for renal stone protocol CT scan.  She is given ibuprofen for pain, ondansetron for nausea.  CT is negative for urolithiasis or nephrolithiasis, no pathology seen to account for patient's symptoms.  Will treat for urethral syndrome, given prescription for doxycycline.  Recommended she use over-the-counter NSAIDs and acetaminophen for pain.  Return precautions discussed.  Final Clinical Impressions(s) / ED Diagnoses   Final diagnoses:  Dysuria  Left flank pain    ED Discharge Orders         Ordered    doxycycline (VIBRAMYCIN) 100 MG capsule  2 times daily,   Status:  Discontinued     03/14/18 0447    doxycycline (VIBRAMYCIN) 100 MG capsule  2 times daily     03/14/18 8270           Delora Fuel, MD 78/67/54 [phone]

## 2018-03-14 NOTE — ED Notes (Signed)
pts family member came to nursing desk asking how much longer it would be. RN explained to family member and thanked them for their patience. NAD Noted. Will continue to monitor.

## 2018-03-14 NOTE — ED Notes (Signed)
Patient transported to CT 

## 2018-03-14 NOTE — ED Notes (Signed)
Pt left at this time with all belongings.  

## 2018-03-14 NOTE — Discharge Instructions (Addendum)
Drink plenty of fluids.  Take ibuprofen or naproxen for pain. Take acetaminophen in addition to tget additional pain relief.  Return if pain is getting worse, or if you start running a fever.

## 2018-03-14 NOTE — ED Triage Notes (Signed)
Patient states that she has had lower back pain, cloudy urine, some blood in her urine and pain when she urinated x 2 weeks  - Reports that she is nauseated.

## 2018-03-14 NOTE — ED Notes (Signed)
Pt provided ginger ale for PO challenge  

## 2018-09-07 IMAGING — DX DG CHEST 2V
2 series · 2 of 2 positions shown · non-contrast
Comparison: None.

CLINICAL DATA: Chest pain

EXAM:
CHEST  2 VIEW

[chest pa]
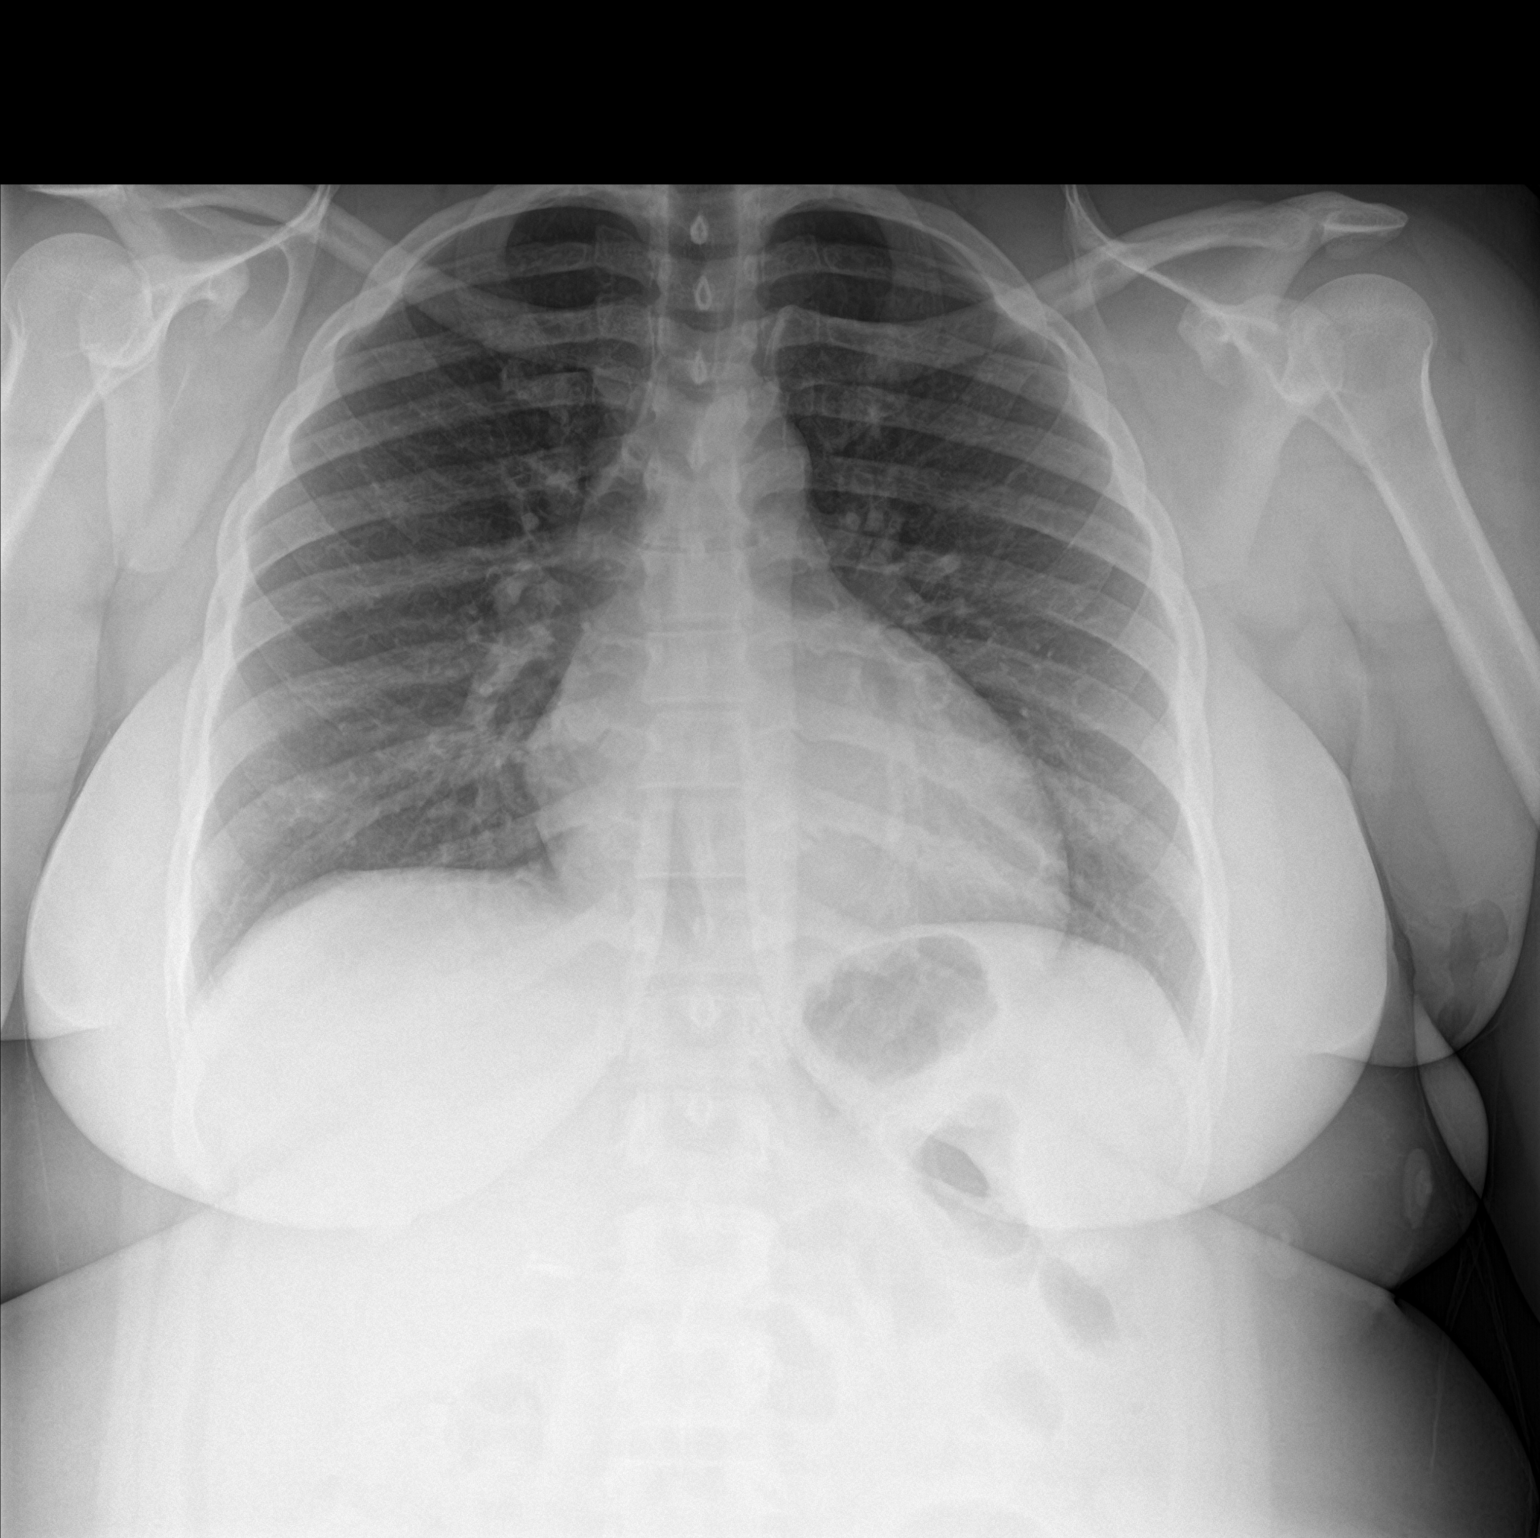

[chest lat]
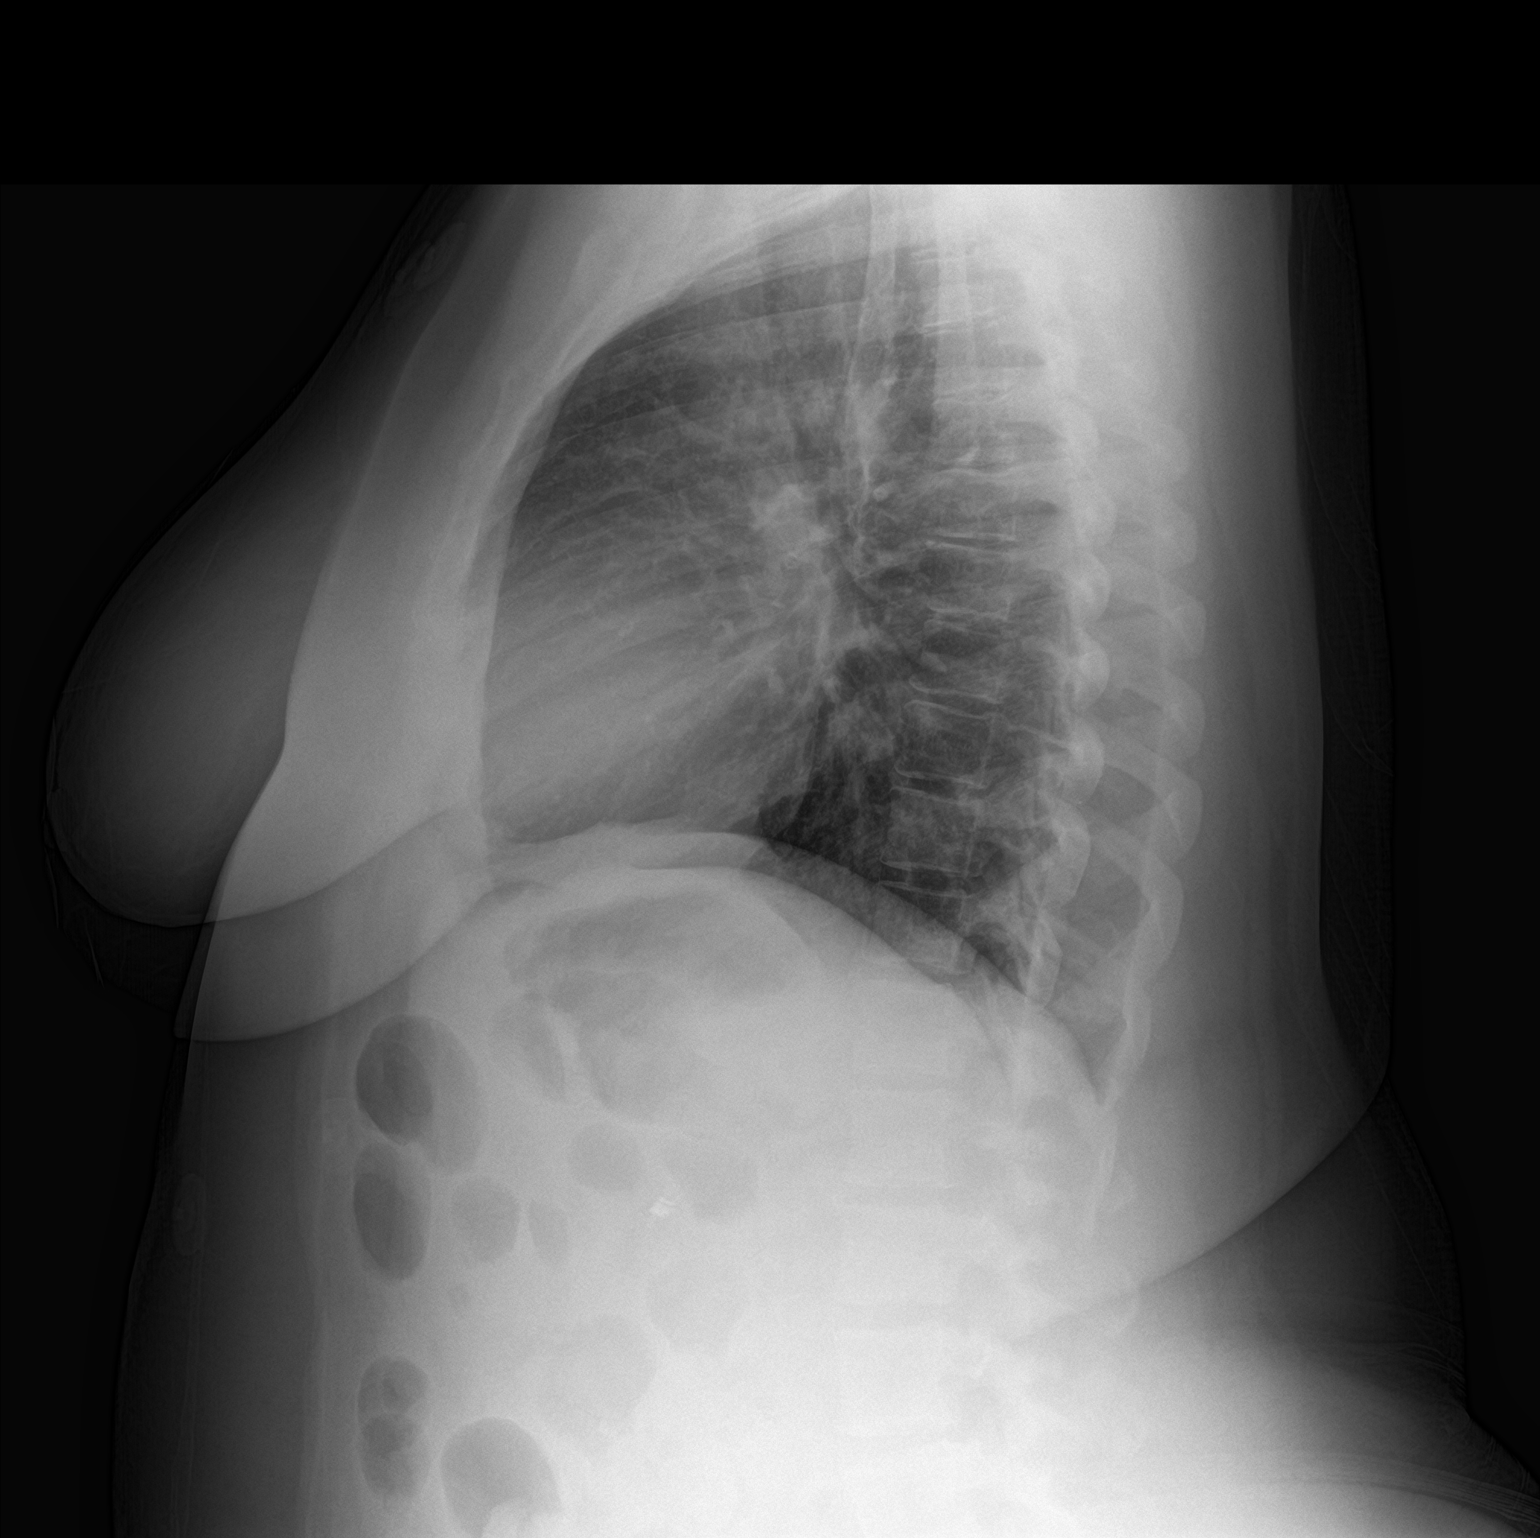

[2 of 2 positions shown; findings below may reference images not displayed]

FINDINGS: Lungs are clear. Heart size and pulmonary vascularity are normal. No
adenopathy. No pneumothorax. No bone lesions.
IMPRESSION: No edema or consolidation.

## 2020-11-05 ENCOUNTER — Other Ambulatory Visit: Payer: Self-pay

## 2020-11-05 ENCOUNTER — Encounter (HOSPITAL_BASED_OUTPATIENT_CLINIC_OR_DEPARTMENT_OTHER): Payer: Self-pay | Admitting: *Deleted

## 2020-11-05 ENCOUNTER — Emergency Department (HOSPITAL_BASED_OUTPATIENT_CLINIC_OR_DEPARTMENT_OTHER): Payer: BLUE CROSS/BLUE SHIELD

## 2020-11-05 ENCOUNTER — Emergency Department (HOSPITAL_BASED_OUTPATIENT_CLINIC_OR_DEPARTMENT_OTHER)
Admission: EM | Admit: 2020-11-05 | Discharge: 2020-11-06 | Disposition: A | Discharge status: Home / Self Care | Payer: BLUE CROSS/BLUE SHIELD | Attending: Emergency Medicine | Admitting: Emergency Medicine

## 2020-11-05 DIAGNOSIS — N7689 Other specified inflammation of vagina and vulva: Secondary | ICD-10-CM | POA: Diagnosis not present

## 2020-11-05 DIAGNOSIS — S9001XA Contusion of right ankle, initial encounter: Secondary | ICD-10-CM | POA: Diagnosis not present

## 2020-11-05 DIAGNOSIS — R030 Elevated blood-pressure reading, without diagnosis of hypertension: Secondary | ICD-10-CM | POA: Insufficient documentation

## 2020-11-05 DIAGNOSIS — F17200 Nicotine dependence, unspecified, uncomplicated: Secondary | ICD-10-CM | POA: Insufficient documentation

## 2020-11-05 DIAGNOSIS — W228XXA Striking against or struck by other objects, initial encounter: Secondary | ICD-10-CM | POA: Insufficient documentation

## 2020-11-05 DIAGNOSIS — N9489 Other specified conditions associated with female genital organs and menstrual cycle: Secondary | ICD-10-CM

## 2020-11-05 DIAGNOSIS — S99911A Unspecified injury of right ankle, initial encounter: Secondary | ICD-10-CM | POA: Diagnosis present

## 2020-11-05 NOTE — ED Triage Notes (Signed)
C/o right ankle injury x 3 days ago , also c/o abscess to labia  x 3 days ago

## 2020-11-06 NOTE — ED Provider Notes (Signed)
[facility] EMERGENCY DEPARTMENT Provider Note   CSN: [id number] Arrival date & time: 11/05/20  2142     History Chief Complaint  Patient presents with   Abscess    Amber Mcguire is a 40 y.o. female.  The history is provided by the patient.  Abscess She has history of asthma, seizures and comes in with 2 complaints.  3 days ago, she accidentally hit the lateral aspect of her right ankle on a board.  She is complaining of pain and swelling in that area.  Pain is rated at 8/10.  It is painful to walk on it but she has been able to ambulate.  She denies other injury.  As a second complaint, she has an abscess on her left labia.  She noticed it about 2 days ago.  It is more uncomfortable than painful does not been growing.  She denies fever or chills.  She denies history of prior Bartholin cyst.   Past Medical History:  Diagnosis Date   Asthma    Kidney stone    Seizures ([place])     There are no problems to display for this patient.   Past Surgical History:  Procedure Laterality Date   CHOLECYSTECTOMY       OB History     Gravida  3   Para  3   Term  3   Preterm  0   AB  0   Living  3      SAB  0   IAB  0   Ectopic  0   Multiple  0   Live Births              Family History  Problem Relation Age of Onset   Anesthesia problems Neg Hx    Hypotension Neg Hx    Malignant hyperthermia Neg Hx    Pseudochol deficiency Neg Hx     Social History   Tobacco Use   Smoking status: Some Days   Smokeless tobacco: Never  Substance Use Topics   Alcohol use: Yes    Comment: occasional   Drug use: No    Home Medications Prior to Admission medications   Medication Sig Start Date End Date Taking? Authorizing Provider  doxycycline (VIBRAMYCIN) 100 MG capsule Take 1 capsule (100 mg total) by mouth 2 (two) times daily. [id number]   Delora Fuel, MD  gabapentin (NEURONTIN) 100 MG capsule Take 1 capsule (100 mg total) by mouth 3 (three) times daily.  12/05/16 12/05/17  Tonette Bihari, MD  ibuprofen (ADVIL,MOTRIN) 800 MG tablet Take 800 mg by mouth every 8 (eight) hours as needed. Patient used this medication for tooth pain.    [provider]  promethazine (PHENERGAN) 25 MG tablet Take 1 tablet (25 mg total) by mouth every 6 (six) hours as needed for nausea. 07/19/11 07/26/11  Jonnie Kind, MD    Allergies    Patient has no known allergies.  Review of Systems   Review of Systems  All other systems reviewed and are negative.  Physical Exam Updated Vital Signs BP (!) 148/85 (BP Location: Right Arm)   Pulse 71   Temp 98.5 F (36.9 C) (Oral)   Resp 18   Ht 5' (1.524 m)   Wt 68 kg   LMP 10/29/2020   SpO2 99%   BMI 29.29 kg/m   Physical Exam Vitals and nursing note reviewed. Exam conducted with a chaperone present.  40 year old female, resting comfortably and in no  acute distress. Vital signs are significant for mildly elevated blood pressure. Oxygen saturation is 99%, which is normal. Head is normocephalic and atraumatic. PERRLA, EOMI. Oropharynx is clear. Neck is nontender and supple without adenopathy. Back is nontender and there is no CVA tenderness. Lungs are clear without rales, wheezes, or rhonchi. Chest is nontender. Heart has regular rate and rhythm without murmur. Abdomen is soft, flat, nontender without masses or hepatosplenomegaly and peristalsis is normoactive. Genitalia: Normal external female genitalia.  No Bartholin cyst is noted.  In the anterior aspect of the labia minora on the left is a nodular density approximately 5 mm in diameter.  This is in the submucosal tissue and is movable and only minimally tender. Extremities: There is slight soft tissue swelling over the lateral aspect of the right ankle with ecchymosis present.  There is no joint effusion.  There is mild tenderness to palpation over the swollen area.  There is no instability.  Distal neurovascular exam is normal.  There is full  passive range of motion present without significant pain. Skin is warm and dry without rash. Neurologic: Mental status is normal, cranial nerves are intact, moves all extremities equally.  ED Results / Procedures / Treatments     Radiology DG Ankle Complete Right  Result Date: 11/05/2020 CLINICAL DATA:  Status post trauma. EXAM: RIGHT ANKLE - COMPLETE 3+ VIEW COMPARISON:  None. FINDINGS: There is no evidence of an acute fracture, dislocation, or joint effusion. A small chronic appearing deformity is seen along the lateral aspect of the right cuboid bone. There is mild lateral soft tissue swelling. IMPRESSION: 1. Mild lateral soft tissue swelling without an acute osseous abnormality. Electronically Signed   By: Virgina Norfolk M.D.   On: 11/05/2020 22:17    Procedures Procedures   Medications Ordered in ED Medications - No data to display  ED Course  I have reviewed the triage vital signs and the nursing notes.  Pertinent imaging results that were available during my care of the patient were reviewed by me and considered in my medical decision making (see chart for details).   MDM Rules/Calculators/A&P                         Contusion of the right ankle.  X-ray showed no evidence of fracture.  Lesion in the labium minorum on the left side.  Location is not suggestive of a Bartholin cyst.  She is advised to follow-up with gynecology for further evaluation of this.  Advised on applying ice to her ankle, use over-the-counter analgesics as needed for pain.  Activity as tolerated.  Old records are reviewed, and she has no relevant past visits.  Final Clinical Impression(s) / ED Diagnoses Final diagnoses:  Contusion of right ankle, initial encounter  Swelling of labia  Elevated blood pressure reading without diagnosis of hypertension    Rx / DC Orders ED Discharge Orders     None        Delora Fuel, MD [id number]

## 2020-11-06 NOTE — Discharge Instructions (Addendum)
The swelling on your labia does not seem to be an abscess.  Please follow-up with the gynecologist for further evaluation.  Please apply ice to your ankle.  Ice to be applied for 30 minutes at a time, 4 times a day.  You may take ibuprofen, naproxen, or acetaminophen as needed for pain.  You can do any activities which are not painful.

## 2020-11-27 ENCOUNTER — Emergency Department (HOSPITAL_BASED_OUTPATIENT_CLINIC_OR_DEPARTMENT_OTHER)
Admission: EM | Admit: 2020-11-27 | Discharge: 2020-11-27 | Disposition: A | Discharge status: Home / Self Care | Payer: BLUE CROSS/BLUE SHIELD | Attending: Emergency Medicine | Admitting: Emergency Medicine

## 2020-11-27 ENCOUNTER — Other Ambulatory Visit: Payer: Self-pay

## 2020-11-27 DIAGNOSIS — Z5321 Procedure and treatment not carried out due to patient leaving prior to being seen by health care provider: Secondary | ICD-10-CM | POA: Insufficient documentation

## 2020-11-27 DIAGNOSIS — M79662 Pain in left lower leg: Secondary | ICD-10-CM | POA: Insufficient documentation

## 2020-11-27 DIAGNOSIS — M79652 Pain in left thigh: Secondary | ICD-10-CM | POA: Diagnosis not present

## 2020-11-27 NOTE — ED Triage Notes (Signed)
States has bee having pain to left inner thigh and pain to left labia for a month. Also having irregular HR for past 3 days

## 2020-12-05 ENCOUNTER — Telehealth: Payer: Self-pay

## 2020-12-05 NOTE — Telephone Encounter (Signed)
Spoke with pt and informed her of provider change for appt on 10/3. Pt is aware that Dr.Davis is covering for Dr.Leggett due to a family emergency. Pt expressed understanding and is willing to see Dr.Davis on 10/3.

## 2020-12-10 ENCOUNTER — Ambulatory Visit (INDEPENDENT_AMBULATORY_CARE_PROVIDER_SITE_OTHER): Payer: BLUE CROSS/BLUE SHIELD | Admitting: Obstetrics and Gynecology

## 2020-12-10 ENCOUNTER — Encounter: Payer: Self-pay | Admitting: Obstetrics and Gynecology

## 2020-12-10 ENCOUNTER — Other Ambulatory Visit: Payer: Self-pay

## 2020-12-10 VITALS — BP 124/85 | HR 66 | Resp 16 | Ht 60.0 in | Wt 163.0 lb

## 2020-12-10 DIAGNOSIS — N9089 Other specified noninflammatory disorders of vulva and perineum: Secondary | ICD-10-CM

## 2020-12-10 NOTE — Progress Notes (Signed)
   GYNECOLOGY OFFICE NOTE  History:  40 y.o. [id number] here today for swelling, irritation in left labia. Patient had episode of pain with orgasm/masturbation, examined herself and found a bump. Seen in ED for ankle issue and referred to [facility] for follow up for bump. Reports swelling has improved and no longer painful but it is ongoing discomfort that she feels regularly. Feels left side of labia/vaginal area is swollen. Has not tried anything for it. Has not seen Gyn in several years.    Past Medical History:  Diagnosis Date   Asthma    Kidney stone    Seizures ([place])     Past Surgical History:  Procedure Laterality Date   CHOLECYSTECTOMY       Current Outpatient Medications:    ibuprofen (ADVIL,MOTRIN) 800 MG tablet, Take 800 mg by mouth every 8 (eight) hours as needed. Patient used this medication for tooth pain. (Patient not taking: Reported on 12/10/2020), Disp: , Rfl:   The following portions of the patient's history were reviewed and updated as appropriate: allergies, current medications, past family history, past medical history, past social history, past surgical history and problem list.   Review of Systems:  Pertinent items noted in HPI and remainder of comprehensive ROS otherwise negative.   Objective:  Physical Exam BP 124/85   Pulse 66   Resp 16   Ht 5' (1.524 m)   Wt 163 lb (Amber.9 kg)   LMP 11/25/2020 (Exact Date)   BMI 31.83 kg/m  CONSTITUTIONAL: Well-developed, well-nourished female in no acute distress.  HENT:  Normocephalic, atraumatic. External right and left ear normal. Oropharynx is clear and moist EYES: Conjunctivae and EOM are normal. Pupils are equal, round, and reactive to light. No scleral icterus.  NECK: Normal range of motion, supple, no masses SKIN: Skin is warm and dry. No rash noted. Not diaphoretic. No erythema. No pallor. NEUROLOGIC: Alert and oriented to person, place, and time. Normal reflexes, muscle tone coordination. No cranial nerve deficit  noted. PSYCHIATRIC: Normal mood and affect. Normal behavior. Normal judgment and thought content. CARDIOVASCULAR: Normal heart rate noted RESPIRATORY: Effort normal, no problems with respiration noted ABDOMEN: Soft, no distention noted.   PELVIC: Normal appearing external genitalia; 2 mm bump at left inner labia majora, no swelling noted, no evidence of bartholins or infection; normal appearing vaginal mucosa and cervix.  No abnormal discharge noted.   MUSCULOSKELETAL: Normal range of motion. No edema noted.  Exam done with chaperone present.  Labs and Imaging No results found.  Assessment & Plan:  1. Vulvar lesion Small cyst seen, no swelling noted on exam Suspect sebaceous cyst was irritated/infected that has healed on its own No further workup at this time Return for annual or if symptoms worsen, may need biopsy   Routine preventative health maintenance measures emphasized. Please refer to After Visit Summary for other counseling recommendations.   Return if symptoms worsen or fail to improve, for annual.  K. Arvilla Meres, MD, [place] for [facility])

## 2021-01-02 ENCOUNTER — Other Ambulatory Visit (HOSPITAL_COMMUNITY)
Admission: RE | Admit: 2021-01-02 | Discharge: 2021-01-02 | Disposition: A | Discharge status: Home / Self Care | Payer: BC Managed Care – PPO | Source: Ambulatory Visit

## 2021-01-02 ENCOUNTER — Other Ambulatory Visit: Payer: Self-pay

## 2021-01-02 ENCOUNTER — Ambulatory Visit (INDEPENDENT_AMBULATORY_CARE_PROVIDER_SITE_OTHER): Payer: BC Managed Care – PPO

## 2021-01-02 VITALS — BP 146/96 | HR 65 | Ht 60.0 in | Wt 164.0 lb

## 2021-01-02 DIAGNOSIS — Z01419 Encounter for gynecological examination (general) (routine) without abnormal findings: Secondary | ICD-10-CM

## 2021-01-02 DIAGNOSIS — R102 Pelvic and perineal pain: Secondary | ICD-10-CM | POA: Diagnosis not present

## 2021-01-02 NOTE — Progress Notes (Signed)
GYNECOLOGY ANNUAL PREVENTATIVE CARE ENCOUNTER NOTE  History:     Amber Mcguire is a 40 y.o. [id number] female here for a routine annual gynecologic exam.  Current complaints: painful orgasms.  Patient states since August she has had painful orgasms. Patient reports she does not have a partner and uses toys to achieve orgasm. She reports the pain occurs during masturbation and when she orgasms. She reports pain in her left groin and even down through her left leg. She describes the pain as sharp and "like nerve pain." She also reports numbness in her left foot after. She reports she has tried different toys and positions to see if it will help and has not had any relief. She states this has never happened before. She reports she feels like the area between her labia majora and labia minora on the left side is swollen. She also reports a fullness and firm feeling in the left side of her abdomen.   She denies any abnormal discharge or bleeding. She reports regular periods that are now a day longer than they used to be but not heavy.    Denies abnormal vaginal bleeding, discharge, pelvic pain, problems with intercourse or other gynecologic concerns.    Gynecologic History Patient's last menstrual period was 12/28/2020. Contraception: none Last Pap: patient reports more than 7 years ago. Result was normal with negative HPV Last Mammogram: n/a due to age. Last Colonoscopy: n/a.  Obstetric History OB History  Gravida Para Term Preterm AB Living  3 3 3  0 0 3  SAB IAB Ectopic Multiple Live Births  0 0 0 0      # Outcome Date GA Lbr Len/2nd Weight Sex Delivery Anes PTL Lv  3 Term 03/11/07    M Vag-Spont     2 Term 07/22/05    M Vag-Spont     1 Term 07/17/01    F Vag-Spont       Past Medical History:  Diagnosis Date   Asthma    Kidney stone    Seizures ([place])     Past Surgical History:  Procedure Laterality Date   CHOLECYSTECTOMY      Current Outpatient Medications on File Prior  to Visit  Medication Sig Dispense Refill   ibuprofen (ADVIL,MOTRIN) 800 MG tablet Take 800 mg by mouth every 8 (eight) hours as needed. Patient used this medication for tooth pain. (Patient not taking: No sig reported)     No current facility-administered medications on file prior to visit.    Allergies  Allergen Reactions   Shellfish Allergy Swelling    Social History:  reports that she has been smoking cigarettes. She has never used smokeless tobacco. She reports current alcohol use. She reports that she does not use drugs.  Family History  Problem Relation Age of Onset   Diabetes Maternal Grandmother    Hypertension Maternal Grandmother    Diabetes Paternal Grandmother    Cervical cancer Maternal Aunt    Anesthesia problems Neg Hx    Hypotension Neg Hx    Malignant hyperthermia Neg Hx    Pseudochol deficiency Neg Hx     The following portions of the patient's history were reviewed and updated as appropriate: allergies, current medications, past family history, past medical history, past social history, past surgical history and problem list.  Review of Systems Pertinent items noted in HPI and remainder of comprehensive ROS otherwise negative.  Physical Exam:  BP (!) 146/96   Pulse 65   Ht 5' (1.524  m)   Wt 164 lb (74.4 kg)   LMP 12/28/2020   BMI 32.03 kg/m  CONSTITUTIONAL: Well-developed, well-nourished female in no acute distress.  HENT:  Normocephalic, atraumatic, External right and left ear normal.  EYES: Conjunctivae and EOM are normal. Pupils are equal, round, and reactive to light. No scleral icterus.  NECK: Normal range of motion, supple, no masses.  Normal thyroid.  SKIN: Skin is warm and dry. No rash noted. Not diaphoretic. No erythema. No pallor. MUSCULOSKELETAL: Normal range of motion. No tenderness.  No cyanosis, clubbing, or edema. NEUROLOGIC: Alert and oriented to person, place, and time. Normal reflexes, muscle tone coordination.  PSYCHIATRIC: Normal  mood and affect. Normal behavior. Normal judgment and thought content. CARDIOVASCULAR: Normal heart rate noted, regular rhythm RESPIRATORY: Clear to auscultation bilaterally. Effort and breath sounds normal, no problems with respiration noted. BREASTS: Symmetric in size. No masses, tenderness, skin changes, nipple drainage, or lymphadenopathy bilaterally. Performed in the presence of a chaperone. ABDOMEN: Soft, no distention noted.  No tenderness, rebound or guarding.  PELVIC: Normal appearing external genitalia and urethral meatus; area of fluctuant fluid near left bartholin gland, no tenderness or enlargement. Left labial area without swelling. normal appearing vaginal mucosa and cervix.  No abnormal vaginal discharge noted.  Pap smear obtained.  Uterus enlarged, 4-5cm above pubic bone, tenderness in left adnexa with firmness noted. No tenderness on right adnexa.  Performed in the presence of a chaperone.   Assessment and Plan:    1. Well woman exam with routine gynecological exam - Pap obtained with HPV cotesting. Discussed routine screening recommendations - Palpable uterine enlargement on bimanual exam consistent with patient complaints. Denies any hx of fibroids. Will send for pelvic ultrasound to rule out structural abnormalities.  - Discussed when bartholin gland would need further attention - Mammogram ordered and discussed follow up through breast center as needed.   Will follow up results of pap smear and manage accordingly. Mammogram scheduled. Routine preventative health maintenance measures emphasized. Please refer to After Visit Summary for other counseling recommendations.   Wende Mott, CNM 01/02/21 10:01 AM

## 2021-01-04 LAB — CYTOLOGY - PAP
Comment: NEGATIVE
Diagnosis: NEGATIVE
High risk HPV: NEGATIVE

## 2021-01-09 ENCOUNTER — Other Ambulatory Visit: Payer: Self-pay

## 2021-01-09 ENCOUNTER — Ambulatory Visit (INDEPENDENT_AMBULATORY_CARE_PROVIDER_SITE_OTHER): Payer: BC Managed Care – PPO

## 2021-01-09 ENCOUNTER — Telehealth: Payer: Self-pay

## 2021-01-09 DIAGNOSIS — R102 Pelvic and perineal pain: Secondary | ICD-10-CM | POA: Diagnosis not present

## 2021-01-09 DIAGNOSIS — Z01419 Encounter for gynecological examination (general) (routine) without abnormal findings: Secondary | ICD-10-CM

## 2021-01-09 DIAGNOSIS — Z1231 Encounter for screening mammogram for malignant neoplasm of breast: Secondary | ICD-10-CM | POA: Diagnosis not present

## 2021-01-09 DIAGNOSIS — N852 Hypertrophy of uterus: Secondary | ICD-10-CM | POA: Diagnosis not present

## 2021-01-09 NOTE — Telephone Encounter (Signed)
Called patient to review results of ultrasound had mammography. Will send message to set up follow up in the office and patient agreeable to plan of care. All questions answered.   Wende Mott, CNM 01/09/21 8:06 PM

## 2021-01-10 ENCOUNTER — Telehealth: Payer: Self-pay | Admitting: *Deleted

## 2021-01-10 ENCOUNTER — Telehealth: Payer: Self-pay

## 2021-01-10 NOTE — Telephone Encounter (Signed)
Pt called the office after speaking with C Neil,CNM yesterday regarding her pelvic U/S report.  Pt states that she has been dealing with severe constipation since Aug.  She is wondering if the fibroids are a causing her constipation.  I recommended taking Miralax daily along with fluid increase to 64 oz water daily.  She had thought about going to the hospital but I told her I didn't think it was something she should go to the ED for.  She is wanting C Neill,CNM to call her to see if she should wait for the referral or go to ED.  Message forwarded to [facility].

## 2021-01-10 NOTE — Telephone Encounter (Signed)
Returned call to patient. Discussed that fibroid could be causing issues with compression of the bowel and motility issues. Discussed constipation relief at length and reassured patient that ED visit is not necessary for constipation. Will schedule with MD as soon as possible for further management of fibroids.   Wende Mott, CNM 01/10/21 8:39 PM

## 2021-01-15 NOTE — Progress Notes (Signed)
GYNECOLOGY OFFICE VISIT NOTE  History:   Amber Mcguire is a 40 y.o. [id number] here today for follow up for the uterine fibroids. She was originally seen regarding painful orgasms but also notes constipation since August. She notes some pain on her left side.   She knows the fibroid could be the cause of her symptoms via compression. She took Miralax for the constipation and this has helped but has not resolved the issue.   Her periods are regular.   She denies any abnormal vaginal discharge, bleeding, pelvic pain or other concerns.     Past Medical History:  Diagnosis Date   Asthma    Kidney stone    Seizures ([place])     Past Surgical History:  Procedure Laterality Date   CHOLECYSTECTOMY      The following portions of the patient's history were reviewed and updated as appropriate: allergies, current medications, past family history, past medical history, past social history, past surgical history and problem list.   Health Maintenance:   Normal pap and negative HRHPV:  Diagnosis  Date Value Ref Range Status  01/02/2021   Final   - Negative for intraepithelial lesion or malignancy (NILM)     Review of Systems:  Pertinent items noted in HPI and remainder of comprehensive ROS otherwise negative.  Physical Exam:  BP (!) 150/98   Pulse (!) 110   Ht 5' (1.524 m)   Wt 163 lb (73.9 kg)   LMP 12/28/2020   BMI 31.83 kg/m  CONSTITUTIONAL: Well-developed, well-nourished female in no acute distress.  HEENT:  Normocephalic, atraumatic. External right and left ear normal. No scleral icterus.  NECK: Normal range of motion, supple, no masses noted on observation SKIN: No rash noted. Not diaphoretic. No erythema. No pallor. MUSCULOSKELETAL: Normal range of motion. No edema noted. NEUROLOGIC: Alert and oriented to person, place, and time. Normal muscle tone coordination. No cranial nerve deficit noted. PSYCHIATRIC: Normal mood and affect. Normal behavior. Normal judgment and  thought content.  CARDIOVASCULAR: Normal heart rate noted RESPIRATORY: Effort and breath sounds normal, no problems with respiration noted ABDOMEN: No masses noted. No other overt distention noted.    PELVIC: Deferred  Labs and Imaging No results found for this or any previous visit (from the past 168 hour(s)). MM 3D SCREEN BREAST BILATERAL  Result Date: 01/09/2021 CLINICAL DATA:  Screening. EXAM: DIGITAL SCREENING BILATERAL MAMMOGRAM WITH TOMOSYNTHESIS AND CAD TECHNIQUE: Bilateral screening digital craniocaudal and mediolateral oblique mammograms were obtained. Bilateral screening digital breast tomosynthesis was performed. The images were evaluated with computer-aided detection. COMPARISON:  None. ACR Breast Density Category b: There are scattered areas of fibroglandular density. FINDINGS: There are no findings suspicious for malignancy. IMPRESSION: No mammographic evidence of malignancy. A result letter of this screening mammogram will be mailed directly to the patient. RECOMMENDATION: Screening mammogram in one year. (Code:SM-B-01Y) BI-RADS CATEGORY  1: Negative. Electronically Signed   By: Fidela Salisbury M.D.   On: 01/09/2021 17:14   US PELVIC COMPLETE WITH TRANSVAGINAL  Result Date: 01/09/2021 CLINICAL DATA:  Pelvic pain LEFT greater than RIGHT for 2-3 months, palpable uterine enlargement during exam, LMP 12/21/2020 EXAM: TRANSABDOMINAL AND TRANSVAGINAL ULTRASOUND OF PELVIS TECHNIQUE: Both transabdominal and transvaginal ultrasound examinations of the pelvis were performed. Transabdominal technique was performed for global imaging of the pelvis including uterus, ovaries, adnexal regions, and pelvic cul-de-sac. It was necessary to proceed with endovaginal exam following the transabdominal exam to visualize the lower uterine segment and cervix. COMPARISON:  07/19/2011 FINDINGS: Uterus Measurements:  13.6 x 5.1 x 7.8 cm = volume: 286 mL. Large mass with heterogeneous echogenicity and a few  shadowing calcifications identified at the posterior RIGHT uterus 8.5 x 7.1 x 7.4 cm consistent with an exophytic leiomyoma. Additional smaller anterior wall intramural leiomyoma 4.6 x 3.7 x 4.0 cm extending to LEFT. Endometrium Thickness: 6 mm.  No endometrial fluid or mass Right ovary Measurements: 3.7 x 2.0 x 2.4 cm = volume: 9.1 mL. Normal morphology without mass Left ovary Measurements: 4.0 x 1.7 x 2.7 cm = volume: 9.8 mL. Normal morphology without mass Other findings Trace free pelvic fluid. No adnexal masses. Patient noted LEFT pelvic pain and nausea during transabdominal imaging in the LEFT pelvis. IMPRESSION: Two uterine leiomyomata, larger 8.5 cm diameter exophytic at posterior RIGHT uterus. Unremarkable endometrial complex and ovaries. Patient noted LEFT pelvic pain and nausea during transabdominal imaging. Electronically Signed   By: Lavonia Dana M.D.   On: 01/09/2021 16:00     2022 [place] images reviewed by me 2013 [place] report reviewed - no fibroid at that time Assessment and Plan:    1. Fibroid - Uterine fibroids: The patient's fibroids are symptomatic and treatment options of expectant management, medical therapy, and surgical therapy were discussed. - Expectant management-The patient's fibroids were discussed and expectant management was offered with strict precautions. - Hormonal therapy in general was discussed consisting of birth control. This would be less effective since her primary symptoms are pain, constipation and not bleeding.  - We discussed options that shrink the fibroids hormonally I.e. depo lupron or elagolix. This symptoms are more helpful when closer to menopause or when they might change the route of surgery.  - [place] -The patient was offered [place] and the procedure was detailed and discussed. Risks and benefits were discussed. Patient was offered a referral to the interventional radiology group. - Additional surgical management includes myomectomy, RFA, or hysterectomy. The risks  and benefits of these surgeries were reviewed as well in depth. We discussed the types of fibroids that are candidates for hysteroscopic resection of fibroids as well however hers are in the wall of the uterus.  - She would like to do a hysterectomy for the fibroids as she would prefer one surgery and definitive management. We discussed the risks associated with hysterectomy.  - Planned surgery: TVH, bilateral salpingectomy, possible TLH or LAVH - Risks of surgery include but are not limited to: bleeding, infection, injury to surrounding organs/tissues (i.e. bowel/bladder/ureters), need for additional procedures, wound complications, hospital re-admission, and conversion to open surgery, additional complications:  if unable to remove  vaginally, may have to do TLH/LAVH - We discussed postop restrictions, precautions and expectations - Preop testing needed: None but per anesthesia guidelines - All questions answered, copy of consent given along with preop information packet    Routine preventative health maintenance measures emphasized. Please refer to After Visit Summary for other counseling recommendations.    [name], MD, [place] for [facility], [place]

## 2021-01-17 ENCOUNTER — Ambulatory Visit (INDEPENDENT_AMBULATORY_CARE_PROVIDER_SITE_OTHER): Payer: BC Managed Care – PPO | Admitting: Obstetrics and Gynecology

## 2021-01-17 ENCOUNTER — Encounter: Payer: Self-pay | Admitting: Obstetrics and Gynecology

## 2021-01-17 ENCOUNTER — Other Ambulatory Visit: Payer: Self-pay

## 2021-01-17 VITALS — BP 150/94 | HR 93 | Ht 60.0 in | Wt 163.0 lb

## 2021-01-17 DIAGNOSIS — D219 Benign neoplasm of connective and other soft tissue, unspecified: Secondary | ICD-10-CM | POA: Diagnosis not present

## 2021-01-21 ENCOUNTER — Telehealth: Payer: Self-pay

## 2021-01-21 NOTE — Telephone Encounter (Signed)
Called patient to discuss potential surgery dates.  1st call - picked up the phone and didn't say anything. I said hello a few times before I hung up. I could hear her background.  2nd call - call went to voicemail. Voicebox is full and cannot accept any new messages.   Will schedule on first available date with Dr. Damita Dunnings. Patient will be notified by mail, does not have mychart.

## 2021-01-23 ENCOUNTER — Encounter (HOSPITAL_BASED_OUTPATIENT_CLINIC_OR_DEPARTMENT_OTHER): Payer: Self-pay | Admitting: Obstetrics and Gynecology

## 2021-01-23 DIAGNOSIS — D259 Leiomyoma of uterus, unspecified: Secondary | ICD-10-CM

## 2021-01-23 NOTE — H&P (Signed)
[facility] Attending History and Physical  Amber Mcguire is a 40 y.o. [id number] here for surgery for the uterine fibroids. She was originally seen regarding painful orgasms but also notes constipation since August. She notes some pain on her left side.   She knows the fibroid could be the cause of her symptoms via compression. She took Miralax for the constipation and this has helped but has not resolved the issue.    Her periods are regular.    She denies any abnormal vaginal discharge, bleeding, pelvic pain or other concerns.  Past Medical History:  Diagnosis Date   Asthma    Kidney stone    Seizures ([place])    Past Surgical History:  Procedure Laterality Date   CHOLECYSTECTOMY     OB History  Gravida Para Term Preterm AB Living  3 3 3  0 0 3  SAB IAB Ectopic Multiple Live Births  0 0 0 0      # Outcome Date GA Lbr Len/2nd Weight Sex Delivery Anes PTL Lv  3 Term 03/11/07    M Vag-Spont     2 Term 07/22/05    M Vag-Spont     1 Term 07/17/01    F Vag-Spont     Patient denies any other pertinent gynecologic issues.  No current facility-administered medications on file prior to encounter.   Current Outpatient Medications on File Prior to Encounter  Medication Sig Dispense Refill   ibuprofen (ADVIL,MOTRIN) 800 MG tablet Take 800 mg by mouth every 8 (eight) hours as needed. Patient used this medication for tooth pain. (Patient not taking: No sig reported)     polyethylene glycol (MIRALAX / GLYCOLAX) 17 g packet Take 17 g by mouth daily.     Allergies  Allergen Reactions   Shellfish Allergy Swelling    Social History:   reports that she has been smoking cigarettes. She has never used smokeless tobacco. She reports current alcohol use. She reports that she does not use drugs. Family History  Problem Relation Age of Onset   Diabetes Maternal Grandmother    Hypertension Maternal Grandmother    Diabetes Paternal Grandmother    Cervical cancer  Maternal Aunt    Anesthesia problems Neg Hx    Hypotension Neg Hx    Malignant hyperthermia Neg Hx    Pseudochol deficiency Neg Hx     Review of Systems: Pertinent items noted in HPI and remainder of comprehensive ROS otherwise negative.  PHYSICAL EXAM: Last menstrual period 12/28/2020. CONSTITUTIONAL: Well-developed, well-nourished female in no acute distress.  HENT:  Normocephalic, atraumatic, External right and left ear normal. Oropharynx is clear and moist EYES: Conjunctivae and EOM are normal. Pupils are equal, round, and reactive to light. No scleral icterus.  NECK: Normal range of motion, supple, no masses SKIN: Skin is warm and dry. No rash noted. Not diaphoretic. No erythema. No pallor. NEUROLOGIC: Alert and oriented to person, place, and time. Normal reflexes, muscle tone coordination. No cranial nerve deficit noted. PSYCHIATRIC: Normal mood and affect. Normal behavior. Normal judgment and thought content.  CARDIOVASCULAR: Normal heart rate noted, regular rhythm RESPIRATORY: Effort and breath sounds normal, no problems with respiration noted ABDOMEN: Soft, nontender, nondistended.  MUSCULOSKELETAL: Normal range of motion. No tenderness.  No cyanosis, clubbing, or edema.  2+ distal pulses.  Labs: Pap normal on 12/2020 and HPV negative   Imaging Studies: MM 3D SCREEN BREAST BILATERAL  Result Date: 01/09/2021 CLINICAL DATA:  Screening. EXAM: DIGITAL SCREENING BILATERAL MAMMOGRAM WITH TOMOSYNTHESIS  AND CAD TECHNIQUE: Bilateral screening digital craniocaudal and mediolateral oblique mammograms were obtained. Bilateral screening digital breast tomosynthesis was performed. The images were evaluated with computer-aided detection. COMPARISON:  None. ACR Breast Density Category b: There are scattered areas of fibroglandular density. FINDINGS: There are no findings suspicious for malignancy. IMPRESSION: No mammographic evidence of malignancy. A result letter of this screening mammogram  will be mailed directly to the patient. RECOMMENDATION: Screening mammogram in one year. (Code:SM-B-01Y) BI-RADS CATEGORY  1: Negative. Electronically Signed   By: Fidela Salisbury M.D.   On: 01/09/2021 17:14   US PELVIC COMPLETE WITH TRANSVAGINAL  Result Date: 01/09/2021 CLINICAL DATA:  Pelvic pain LEFT greater than RIGHT for 2-3 months, palpable uterine enlargement during exam, LMP 12/21/2020 EXAM: TRANSABDOMINAL AND TRANSVAGINAL ULTRASOUND OF PELVIS TECHNIQUE: Both transabdominal and transvaginal ultrasound examinations of the pelvis were performed. Transabdominal technique was performed for global imaging of the pelvis including uterus, ovaries, adnexal regions, and pelvic cul-de-sac. It was necessary to proceed with endovaginal exam following the transabdominal exam to visualize the lower uterine segment and cervix. COMPARISON:  07/19/2011 FINDINGS: Uterus Measurements: 13.6 x 5.1 x 7.8 cm = volume: 286 mL. Large mass with heterogeneous echogenicity and a few shadowing calcifications identified at the posterior RIGHT uterus 8.5 x 7.1 x 7.4 cm consistent with an exophytic leiomyoma. Additional smaller anterior wall intramural leiomyoma 4.6 x 3.7 x 4.0 cm extending to LEFT. Endometrium Thickness: 6 mm.  No endometrial fluid or mass Right ovary Measurements: 3.7 x 2.0 x 2.4 cm = volume: 9.1 mL. Normal morphology without mass Left ovary Measurements: 4.0 x 1.7 x 2.7 cm = volume: 9.8 mL. Normal morphology without mass Other findings Trace free pelvic fluid. No adnexal masses. Patient noted LEFT pelvic pain and nausea during transabdominal imaging in the LEFT pelvis. IMPRESSION: Two uterine leiomyomata, larger 8.5 cm diameter exophytic at posterior RIGHT uterus. Unremarkable endometrial complex and ovaries. Patient noted LEFT pelvic pain and nausea during transabdominal imaging. Electronically Signed   By: Lavonia Dana M.D.   On: 01/09/2021 16:00    Assessment: Active Problems:   Uterine  fibroid   Plan:  1. Fibroid - Uterine fibroids: The patient's fibroids are symptomatic and treatment options of expectant management, medical therapy, and surgical therapy were discussed. - Expectant management-The patient's fibroids were discussed and expectant management was offered with strict precautions. - Hormonal therapy in general was discussed consisting of birth control. This would be less effective since her primary symptoms are pain, constipation and not bleeding.  - We discussed options that shrink the fibroids hormonally I.e. depo lupron or elagolix. This symptoms are more helpful when closer to menopause or when they might change the route of surgery.  - [place] -The patient was offered [place] and the procedure was detailed and discussed. Risks and benefits were discussed. Patient was offered a referral to the interventional radiology group. - Additional surgical management includes myomectomy, RFA, or hysterectomy. The risks and benefits of these surgeries were reviewed as well in depth. We discussed the types of fibroids that are candidates for hysteroscopic resection of fibroids as well however hers are in the wall of the uterus.  - She would like to do a hysterectomy for the fibroids as she would prefer one surgery and definitive management. We discussed the risks associated with hysterectomy.  - Planned surgery: TVH, bilateral salpingectomy, possible TLH or LAVH - Risks of surgery include but are not limited to: bleeding, infection, injury to surrounding organs/tissues (i.e. bowel/bladder/ureters), need for additional  procedures, wound complications, hospital re-admission, and conversion to open surgery, additional complications:  if unable to remove  vaginally, may have to do TLH/LAVH - We discussed postop restrictions, precautions and expectations - Preop testing needed: None but per anesthesia guidelines - All questions answered, copy of consent given along with preop information  packet  Routine floor care    [name], MD, [place] for [facility], [place]

## 2021-01-25 ENCOUNTER — Encounter (HOSPITAL_BASED_OUTPATIENT_CLINIC_OR_DEPARTMENT_OTHER): Payer: Self-pay | Admitting: Obstetrics and Gynecology

## 2021-01-25 ENCOUNTER — Other Ambulatory Visit: Payer: Self-pay

## 2021-01-25 DIAGNOSIS — D252 Subserosal leiomyoma of uterus: Secondary | ICD-10-CM | POA: Diagnosis not present

## 2021-01-25 DIAGNOSIS — Z01812 Encounter for preprocedural laboratory examination: Secondary | ICD-10-CM | POA: Diagnosis not present

## 2021-01-25 DIAGNOSIS — Z973 Presence of spectacles and contact lenses: Secondary | ICD-10-CM

## 2021-01-25 HISTORY — DX: Presence of spectacles and contact lenses: Z97.3

## 2021-01-25 NOTE — Progress Notes (Signed)
Your procedure is scheduled on 02-06-2021  Report to [facility]   Call this number if you have problems the morning of surgery  [phone].   OUR ADDRESS IS [address].  WE ARE LOCATED IN [facility].  PLEASE BRING YOUR INSURANCE CARD AND PHOTO ID DAY OF SURGERY.  ONLY ONE PERSON ALLOWED IN FACILITY WAITING AREA.                                     REMEMBER:  DO NOT EAT FOOD, CANDY GUM OR MINTS  AFTER MIDNIGHT . YOU MAY HAVE CLEAR LIQUIDS FROM MIDNIGHT UNTIL 430 AM. NO CLEAR LIQUIDS AFTER  430 AM DAY OF SURGERY.   YOU MAY  BRUSH YOUR TEETH MORNING OF SURGERY AND RINSE YOUR MOUTH OUT, NO CHEWING GUM CANDY OR MINTS.    CLEAR LIQUID DIET   Foods Allowed                                                                     Foods Excluded  Coffee and tea, regular and decaf                             liquids that you cannot  Plain Jell-O any favor except red or purple                                           see through such as: Fruit ices (not with fruit pulp)                                     milk, soups, orange juice  Iced Popsicles                                    All solid food Carbonated beverages, regular and diet                                    Cranberry, grape and apple juices Sports drinks like Gatorade Sugar  Sample Menu Breakfast                                Lunch                                     Supper Cranberry juice  Jell-O                                     Grape juice                           Apple juice Coffee or tea                        Jell-O                                      Popsicle                                                Coffee or tea                        Coffee or tea  _____________________________________________________________________     TAKE THESE MEDICATIONS MORNING OF SURGERY WITH A SIP OF WATER:   NONE  ONE VISITOR IS ALLOWED IN WAITING ROOM ONLY DAY OF SURGERY.  YOU MAY HAVE ANOTHER PERSON SWITCH OUT WITH THE  1  VISITOR IN THE WAITING ROOM DAY OF SURGERY AND A MASK MUST BE WORN IN THE WAITING ROOM.    2 VISITORS  MAY VISIT IN THE EXTENDED RECOVERY ROOM UNTIL 800 PM ONLY 1 VISITOR AGE 51 AND OVER MAY SPEND THE NIGHT AND MUST BE IN EXTENDED RECOVERY ROOM NO LATER THAN 800 PM .   UP TO 2 CHILDREN AGE 29 TO 15 MAY ALSO VISIT IN EXTENDED RECOVERY ROOM ONLY UNTIL 800 PM AND MUST LEAVE BY 800 PM. ALL PERSONS VISITING IN EXTENDED RECOVERY ROOM MUST WEAR A MASK.                                    DO NOT WEAR JEWERLY, MAKE UP. DO NOT WEAR LOTIONS, POWDERS, PERFUMES OR NAIL POLISH ON YOUR FINGERNAILS. TOENAIL POLISH IS OK TO WEAR. DO NOT SHAVE FOR 48 HOURS PRIOR TO DAY OF SURGERY. MEN MAY SHAVE FACE AND NECK. CONTACTS, GLASSES, OR DENTURES MAY NOT BE WORN TO SURGERY.                                    [facility] IS NOT RESPONSIBLE  FOR ANY BELONGINGS.                                                                    Marland Kitchen           [place] - Preparing for Surgery Before surgery, you can play an important role.  Because skin is not sterile, your skin needs to be as free of germs as possible.  You can reduce the number of germs on your skin by  washing with CHG (chlorahexidine gluconate) soap before surgery.  CHG is an antiseptic cleaner which kills germs and bonds with the skin to continue killing germs even after washing. Please DO NOT use if you have an allergy to CHG or antibacterial soaps.  If your skin becomes reddened/irritated stop using the CHG and inform your nurse when you arrive at Short Stay. Do not shave (including legs and underarms) for at least 48 hours prior to the first CHG shower.  You may shave your face/neck. Please follow these instructions carefully:  1.  Shower with CHG Soap the night before surgery and the  morning of Surgery.  2.  If you choose to wash your hair, wash  your hair first as usual with your  normal  shampoo.  3.  After you shampoo, rinse your hair and body thoroughly to remove the  shampoo.                            4.  Use CHG as you would any other liquid soap.  You can apply chg directly  to the skin and wash                      Gently with a scrungie or clean washcloth.  5.  Apply the CHG Soap to your body ONLY FROM THE NECK DOWN.   Do not use on face/ open                           Wound or open sores. Avoid contact with eyes, ears mouth and genitals (private parts).                       Wash face,  Genitals (private parts) with your normal soap.             6.  Wash thoroughly, paying special attention to the area where your surgery  will be performed.  7.  Thoroughly rinse your body with warm water from the neck down.  8.  DO NOT shower/wash with your normal soap after using and rinsing off  the CHG Soap.                9.  Pat yourself dry with a clean towel.            10.  Wear clean pajamas.            11.  Place clean sheets on your bed the night of your first shower and do not  sleep with pets. Day of Surgery : Do not apply any lotions/deodorants the morning of surgery.  Please wear clean clothes to the hospital/surgery center.  IF YOU HAVE ANY SKIN IRRITATION OR PROBLEMS WITH THE SURGICAL SOAP, PLEASE GET A BAR OF GOLD DIAL SOAP AND SHOWER THE NIGHT BEFORE YOUR SURGERY AND THE MORNING OF YOUR SURGERY. PLEASE LET THE NURSE KNOW MORNING OF YOUR SURGERY IF YOU HAD ANY PROBLEMS WITH THE SURGICAL SOAP.  FAILURE TO FOLLOW THESE INSTRUCTIONS MAY RESULT IN THE CANCELLATION OF YOUR SURGERY PATIENT SIGNATURE_________________________________  NURSE SIGNATURE__________________________________  ________________________________________________________________________  QUESTIONS CALL [facility] PRE OP NURSE PHONE [phone].

## 2021-01-25 NOTE — Progress Notes (Addendum)
Spoke w/ via phone for pre-op interview---pt Lab needs dos----urine preg               Lab results------lab appt 02-04-2021 1000 am for [facility]bc t & s EKG 11-27-20 CHART/EPIC COVID test -----patient states asymptomatic no test needed Arrive at -------530 am 02-06-2021 NPO after MN NO Solid Food.  Clear liquids from MN until---430 am Med rec completed Medications to take morning of surgery -----none Patient instructed no nail polish to be worn day of surgery Patient instructed to bring photo id and insurance card day of surgery Patient aware to have Driver (ride ) / caregiver    for 24 hours after surgery  mother or sister will Patient Special Instructions -----pt given extended recovery instructions Pre-Op special Istructions -----no vaping 24 hours before surgery Patient verbalized understanding of instructions that were given at this phone interview. Patient denies shortness of breath, chest pain, fever, cough at this phone interview.

## 2021-02-04 ENCOUNTER — Other Ambulatory Visit: Payer: Self-pay

## 2021-02-04 ENCOUNTER — Encounter (HOSPITAL_COMMUNITY)
Admission: RE | Admit: 2021-02-04 | Discharge: 2021-02-04 | Disposition: A | Discharge status: Home / Self Care | Payer: Medicaid Other | Source: Ambulatory Visit | Attending: Obstetrics and Gynecology | Admitting: Obstetrics and Gynecology

## 2021-02-04 DIAGNOSIS — Z01812 Encounter for preprocedural laboratory examination: Secondary | ICD-10-CM | POA: Insufficient documentation

## 2021-02-04 DIAGNOSIS — D252 Subserosal leiomyoma of uterus: Secondary | ICD-10-CM | POA: Insufficient documentation

## 2021-02-04 LAB — CBC
HCT: 38.1 % (ref 36.0–46.0)
Hemoglobin: 12.4 g/dL (ref 12.0–15.0)
MCH: 25.9 pg — ABNORMAL LOW (ref 26.0–34.0)
MCHC: 32.5 g/dL (ref 30.0–36.0)
MCV: 79.7 fL — ABNORMAL LOW (ref 80.0–100.0)
Platelets: 231 10*3/uL (ref 150–400)
RBC: 4.78 MIL/uL (ref 3.87–5.11)
RDW: 15.6 % — ABNORMAL HIGH (ref 11.5–15.5)
WBC: 4.7 10*3/uL (ref 4.0–10.5)
nRBC: 0 % (ref 0.0–0.2)

## 2021-02-05 NOTE — Anesthesia Preprocedure Evaluation (Addendum)
Anesthesia Evaluation  Patient identified by MRN, date of birth, ID band Patient awake    Reviewed: Allergy & Precautions, NPO status , Patient's Chart, lab work & pertinent test results  Airway Mallampati: II  TM Distance: >3 FB Neck ROM: Full    Dental no notable dental hx.    Pulmonary Current Smoker and Patient abstained from smoking.,    Pulmonary exam normal breath sounds clear to auscultation       Cardiovascular negative cardio ROS Normal cardiovascular exam Rhythm:Regular Rate:Normal     Neuro/Psych negative neurological ROS  negative psych ROS   GI/Hepatic negative GI ROS, (+)     substance abuse  marijuana use,   Endo/Other  negative endocrine ROS  Renal/GU negative Renal ROS     Musculoskeletal negative musculoskeletal ROS (+)   Abdominal (+) + obese,   Peds  Hematology negative hematology ROS (+)   Anesthesia Other Findings Fibroids  Reproductive/Obstetrics hcg negative                           Anesthesia Physical Anesthesia Plan  ASA: 2  Anesthesia Plan: General   Post-op Pain Management:    Induction: Intravenous  PONV Risk Score and Plan: 3 and Ondansetron, Dexamethasone, Midazolam, Scopolamine patch - Pre-op and Treatment may vary due to age or medical condition  Airway Management Planned: Oral ETT  Additional Equipment:   Intra-op Plan:   Post-operative Plan: Extubation in OR  Informed Consent: I have reviewed the patients History and Physical, chart, labs and discussed the procedure including the risks, benefits and alternatives for the proposed anesthesia with the patient or authorized representative who has indicated his/her understanding and acceptance.     Dental advisory given  Plan Discussed with: CRNA  Anesthesia Plan Comments:       Anesthesia Quick Evaluation

## 2021-02-06 ENCOUNTER — Ambulatory Visit (HOSPITAL_BASED_OUTPATIENT_CLINIC_OR_DEPARTMENT_OTHER)
Admission: RE | Admit: 2021-02-06 | Discharge: 2021-02-07 | Disposition: A | Discharge status: Home / Self Care | Payer: Medicaid Other | Attending: Obstetrics and Gynecology | Admitting: Obstetrics and Gynecology

## 2021-02-06 ENCOUNTER — Encounter (HOSPITAL_BASED_OUTPATIENT_CLINIC_OR_DEPARTMENT_OTHER)
Admission: RE | Disposition: A | Discharge status: Home / Self Care | Payer: Self-pay | Source: Home / Self Care | Attending: Obstetrics and Gynecology

## 2021-02-06 ENCOUNTER — Other Ambulatory Visit: Payer: Self-pay

## 2021-02-06 ENCOUNTER — Ambulatory Visit (HOSPITAL_BASED_OUTPATIENT_CLINIC_OR_DEPARTMENT_OTHER): Payer: Medicaid Other | Admitting: Anesthesiology

## 2021-02-06 ENCOUNTER — Encounter (HOSPITAL_BASED_OUTPATIENT_CLINIC_OR_DEPARTMENT_OTHER): Payer: Self-pay | Admitting: Obstetrics and Gynecology

## 2021-02-06 DIAGNOSIS — N83202 Unspecified ovarian cyst, left side: Secondary | ICD-10-CM | POA: Insufficient documentation

## 2021-02-06 DIAGNOSIS — N888 Other specified noninflammatory disorders of cervix uteri: Secondary | ICD-10-CM | POA: Insufficient documentation

## 2021-02-06 DIAGNOSIS — D252 Subserosal leiomyoma of uterus: Secondary | ICD-10-CM

## 2021-02-06 DIAGNOSIS — K5900 Constipation, unspecified: Secondary | ICD-10-CM | POA: Insufficient documentation

## 2021-02-06 DIAGNOSIS — F17210 Nicotine dependence, cigarettes, uncomplicated: Secondary | ICD-10-CM | POA: Insufficient documentation

## 2021-02-06 DIAGNOSIS — D251 Intramural leiomyoma of uterus: Secondary | ICD-10-CM | POA: Diagnosis present

## 2021-02-06 DIAGNOSIS — D259 Leiomyoma of uterus, unspecified: Secondary | ICD-10-CM

## 2021-02-06 HISTORY — DX: Personal history of other diseases of the nervous system and sense organs: Z86.69

## 2021-02-06 HISTORY — DX: Constipation, unspecified: K59.00

## 2021-02-06 HISTORY — PX: LAPAROSCOPIC VAGINAL HYSTERECTOMY WITH SALPINGECTOMY: SHX6680

## 2021-02-06 HISTORY — DX: Personal history of urinary calculi: Z87.442

## 2021-02-06 HISTORY — DX: Leiomyoma of uterus, unspecified: D25.9

## 2021-02-06 HISTORY — DX: Personal history of other diseases of the respiratory system: Z87.09

## 2021-02-06 LAB — TYPE AND SCREEN
ABO/RH(D): B NEG
Antibody Screen: NEGATIVE

## 2021-02-06 LAB — ABO/RH: ABO/RH(D): B NEG

## 2021-02-06 LAB — POCT PREGNANCY, URINE: Preg Test, Ur: NEGATIVE

## 2021-02-06 SURGERY — HYSTERECTOMY, VAGINAL, LAPAROSCOPY-ASSISTED, WITH SALPINGECTOMY
Anesthesia: General | Site: Abdomen | Laterality: Bilateral

## 2021-02-06 MED ORDER — KETAMINE HCL-SODIUM CHLORIDE 100-0.9 MG/10ML-% IV SOSY
PREFILLED_SYRINGE | INTRAVENOUS | Status: DC | PRN
  Administered 2021-02-06: 10 mg via INTRAVENOUS
  Administered 2021-02-06: 40 mg via INTRAVENOUS

## 2021-02-06 MED ORDER — GLYCOPYRROLATE 0.2 MG/ML IJ SOLN
INTRAMUSCULAR | Status: DC | PRN
Start: 2021-02-06 — End: 2021-02-06
  Administered 2021-02-06: .2 mg via INTRAVENOUS

## 2021-02-06 MED ORDER — HYDRALAZINE HCL 20 MG/ML IJ SOLN
INTRAMUSCULAR | Status: AC
  Filled 2021-02-06: qty 1

## 2021-02-06 MED ORDER — PANTOPRAZOLE SODIUM 40 MG PO TBEC
DELAYED_RELEASE_TABLET | ORAL | Status: AC
  Filled 2021-02-06: qty 1

## 2021-02-06 MED ORDER — SODIUM CHLORIDE 0.9 % IR SOLN
Status: DC | PRN
  Administered 2021-02-06: 1000 mL

## 2021-02-06 MED ORDER — ACETAMINOPHEN 500 MG PO TABS
1000.0000 mg | ORAL_TABLET | ORAL | Status: AC
  Administered 2021-02-06: 1000 mg via ORAL

## 2021-02-06 MED ORDER — SCOPOLAMINE 1 MG/3DAYS TD PT72
MEDICATED_PATCH | TRANSDERMAL | Status: AC
  Filled 2021-02-06: qty 1

## 2021-02-06 MED ORDER — ACETAMINOPHEN 500 MG PO TABS
ORAL_TABLET | ORAL | Status: AC
  Filled 2021-02-06: qty 2

## 2021-02-06 MED ORDER — DEXAMETHASONE SODIUM PHOSPHATE 10 MG/ML IJ SOLN
INTRAMUSCULAR | Status: DC | PRN
  Administered 2021-02-06: 10 mg via INTRAVENOUS

## 2021-02-06 MED ORDER — LABETALOL HCL 5 MG/ML IV SOLN
INTRAVENOUS | Status: DC | PRN
  Administered 2021-02-06 (×2): 5 mg via INTRAVENOUS

## 2021-02-06 MED ORDER — PANTOPRAZOLE SODIUM 40 MG PO TBEC
40.0000 mg | DELAYED_RELEASE_TABLET | Freq: Every day | ORAL | Status: DC
  Administered 2021-02-06: 40 mg via ORAL

## 2021-02-06 MED ORDER — ACETAMINOPHEN 500 MG PO TABS
1000.0000 mg | ORAL_TABLET | Freq: Four times a day (QID) | ORAL | Status: DC
  Administered 2021-02-06 – 2021-02-07 (×4): 1000 mg via ORAL

## 2021-02-06 MED ORDER — ACETAMINOPHEN 325 MG PO TABS: 650.0000 mg | ORAL_TABLET | Freq: Four times a day (QID) | ORAL | Status: DC | PRN

## 2021-02-06 MED ORDER — FENTANYL CITRATE (PF) 100 MCG/2ML IJ SOLN
INTRAMUSCULAR | Status: DC | PRN
  Administered 2021-02-06: 50 ug via INTRAVENOUS
  Administered 2021-02-06: 100 ug via INTRAVENOUS
  Administered 2021-02-06: 50 ug via INTRAVENOUS
  Administered 2021-02-06: 150 ug via INTRAVENOUS

## 2021-02-06 MED ORDER — ROCURONIUM BROMIDE 10 MG/ML (PF) SYRINGE
PREFILLED_SYRINGE | INTRAVENOUS | Status: AC
  Filled 2021-02-06: qty 10

## 2021-02-06 MED ORDER — WHITE PETROLATUM EX OINT
TOPICAL_OINTMENT | CUTANEOUS | Status: AC
  Filled 2021-02-06: qty 5

## 2021-02-06 MED ORDER — MIDAZOLAM HCL 2 MG/2ML IJ SOLN
INTRAMUSCULAR | Status: AC
  Filled 2021-02-06: qty 2

## 2021-02-06 MED ORDER — LIDOCAINE 2% (20 MG/ML) 5 ML SYRINGE
INTRAMUSCULAR | Status: DC | PRN
  Administered 2021-02-06: 60 mg via INTRAVENOUS

## 2021-02-06 MED ORDER — PROMETHAZINE HCL 25 MG/ML IJ SOLN: 6.2500 mg | INTRAMUSCULAR | Status: DC | PRN

## 2021-02-06 MED ORDER — FENTANYL CITRATE (PF) 100 MCG/2ML IJ SOLN
25.0000 ug | INTRAMUSCULAR | Status: DC | PRN
  Administered 2021-02-06 (×3): 25 ug via INTRAVENOUS

## 2021-02-06 MED ORDER — AMISULPRIDE (ANTIEMETIC) 5 MG/2ML IV SOLN: 10.0000 mg | Freq: Once | INTRAVENOUS | Status: DC | PRN

## 2021-02-06 MED ORDER — HYDROMORPHONE HCL 1 MG/ML IJ SOLN
INTRAMUSCULAR | Status: DC | PRN
  Administered 2021-02-06: 1 mg via INTRAVENOUS

## 2021-02-06 MED ORDER — ONDANSETRON HCL 4 MG PO TABS: 4.0000 mg | ORAL_TABLET | Freq: Four times a day (QID) | ORAL | Status: DC | PRN

## 2021-02-06 MED ORDER — OXYCODONE HCL 5 MG PO TABS
ORAL_TABLET | ORAL | Status: AC
  Filled 2021-02-06: qty 1

## 2021-02-06 MED ORDER — HYDROMORPHONE HCL 2 MG/ML IJ SOLN
INTRAMUSCULAR | Status: AC
  Filled 2021-02-06: qty 1

## 2021-02-06 MED ORDER — MIDAZOLAM HCL 5 MG/5ML IJ SOLN
INTRAMUSCULAR | Status: DC | PRN
  Administered 2021-02-06: 2 mg via INTRAVENOUS

## 2021-02-06 MED ORDER — DEXAMETHASONE SODIUM PHOSPHATE 10 MG/ML IJ SOLN
INTRAMUSCULAR | Status: AC
  Filled 2021-02-06: qty 1

## 2021-02-06 MED ORDER — GLYCOPYRROLATE PF 0.2 MG/ML IJ SOSY
PREFILLED_SYRINGE | INTRAMUSCULAR | Status: AC
  Filled 2021-02-06: qty 1

## 2021-02-06 MED ORDER — OXYCODONE HCL 5 MG PO TABS
ORAL_TABLET | ORAL | Status: AC
  Filled 2021-02-06: qty 2

## 2021-02-06 MED ORDER — SCOPOLAMINE 1 MG/3DAYS TD PT72
1.0000 | MEDICATED_PATCH | TRANSDERMAL | Status: DC
  Administered 2021-02-06: 1.5 mg via TRANSDERMAL

## 2021-02-06 MED ORDER — CEFAZOLIN SODIUM-DEXTROSE 2-4 GM/100ML-% IV SOLN
2.0000 g | INTRAVENOUS | Status: AC
  Administered 2021-02-06: 2 g via INTRAVENOUS

## 2021-02-06 MED ORDER — OXYCODONE HCL 5 MG PO TABS: 5.0000 mg | ORAL_TABLET | ORAL | 0 refills | Status: DC | PRN

## 2021-02-06 MED ORDER — SIMETHICONE 80 MG PO CHEW: 80.0000 mg | CHEWABLE_TABLET | Freq: Four times a day (QID) | ORAL | Status: DC | PRN

## 2021-02-06 MED ORDER — FENTANYL CITRATE (PF) 100 MCG/2ML IJ SOLN
INTRAMUSCULAR | Status: AC
  Filled 2021-02-06: qty 2

## 2021-02-06 MED ORDER — ROCURONIUM BROMIDE 10 MG/ML (PF) SYRINGE
PREFILLED_SYRINGE | INTRAVENOUS | Status: DC | PRN
  Administered 2021-02-06 (×2): 20 mg via INTRAVENOUS
  Administered 2021-02-06: 40 mg via INTRAVENOUS
  Administered 2021-02-06 (×2): 20 mg via INTRAVENOUS

## 2021-02-06 MED ORDER — SUGAMMADEX SODIUM 200 MG/2ML IV SOLN
INTRAVENOUS | Status: DC | PRN
  Administered 2021-02-06: 200 mg via INTRAVENOUS

## 2021-02-06 MED ORDER — KETOROLAC TROMETHAMINE 30 MG/ML IJ SOLN
INTRAMUSCULAR | Status: AC
  Filled 2021-02-06: qty 4

## 2021-02-06 MED ORDER — ONDANSETRON HCL 4 MG/2ML IJ SOLN
INTRAMUSCULAR | Status: AC
  Filled 2021-02-06: qty 2

## 2021-02-06 MED ORDER — HYDRALAZINE HCL 20 MG/ML IJ SOLN
INTRAMUSCULAR | Status: DC | PRN
  Administered 2021-02-06 (×2): 5 mg via INTRAVENOUS

## 2021-02-06 MED ORDER — KETOROLAC TROMETHAMINE 30 MG/ML IJ SOLN: 30.0000 mg | Freq: Once | INTRAMUSCULAR | Status: DC

## 2021-02-06 MED ORDER — KETAMINE HCL 50 MG/5ML IJ SOSY
PREFILLED_SYRINGE | INTRAMUSCULAR | Status: AC
  Filled 2021-02-06: qty 5

## 2021-02-06 MED ORDER — OXYCODONE HCL 5 MG PO TABS
5.0000 mg | ORAL_TABLET | ORAL | Status: DC | PRN
  Administered 2021-02-06: 5 mg via ORAL
  Administered 2021-02-06: 10 mg via ORAL
  Administered 2021-02-06: 5 mg via ORAL
  Administered 2021-02-07 (×2): 10 mg via ORAL

## 2021-02-06 MED ORDER — PROPOFOL 10 MG/ML IV BOLUS
INTRAVENOUS | Status: AC
  Filled 2021-02-06: qty 20

## 2021-02-06 MED ORDER — OXYCODONE HCL 5 MG/5ML PO SOLN: 5.0000 mg | Freq: Once | ORAL | Status: DC | PRN

## 2021-02-06 MED ORDER — PROPOFOL 10 MG/ML IV BOLUS
INTRAVENOUS | Status: DC | PRN
  Administered 2021-02-06: 150 mg via INTRAVENOUS
  Administered 2021-02-06: 50 mg via INTRAVENOUS

## 2021-02-06 MED ORDER — OXYCODONE HCL 5 MG PO TABS: 5.0000 mg | ORAL_TABLET | Freq: Once | ORAL | Status: DC | PRN

## 2021-02-06 MED ORDER — FENTANYL CITRATE (PF) 250 MCG/5ML IJ SOLN
INTRAMUSCULAR | Status: AC
  Filled 2021-02-06: qty 5

## 2021-02-06 MED ORDER — SODIUM CHLORIDE 0.9 % IV SOLN
INTRAVENOUS | Status: AC | PRN
  Administered 2021-02-06: 1000 mL

## 2021-02-06 MED ORDER — PHENYLEPHRINE 40 MCG/ML (10ML) SYRINGE FOR IV PUSH (FOR BLOOD PRESSURE SUPPORT)
PREFILLED_SYRINGE | INTRAVENOUS | Status: AC
  Filled 2021-02-06: qty 10

## 2021-02-06 MED ORDER — IBUPROFEN 600 MG PO TABS: 600.0000 mg | ORAL_TABLET | Freq: Four times a day (QID) | ORAL | 3 refills | Status: DC | PRN

## 2021-02-06 MED ORDER — SOD CITRATE-CITRIC ACID 500-334 MG/5ML PO SOLN: 30.0000 mL | ORAL | Status: AC

## 2021-02-06 MED ORDER — KETOROLAC TROMETHAMINE 15 MG/ML IJ SOLN: 15.0000 mg | INTRAMUSCULAR | Status: AC

## 2021-02-06 MED ORDER — LIDOCAINE 2% (20 MG/ML) 5 ML SYRINGE
INTRAMUSCULAR | Status: AC
  Filled 2021-02-06: qty 5

## 2021-02-06 MED ORDER — CEFAZOLIN SODIUM-DEXTROSE 2-4 GM/100ML-% IV SOLN
INTRAVENOUS | Status: AC
  Filled 2021-02-06: qty 100

## 2021-02-06 MED ORDER — PHENYLEPHRINE 40 MCG/ML (10ML) SYRINGE FOR IV PUSH (FOR BLOOD PRESSURE SUPPORT)
PREFILLED_SYRINGE | INTRAVENOUS | Status: DC | PRN
  Administered 2021-02-06: 80 ug via INTRAVENOUS

## 2021-02-06 MED ORDER — ONDANSETRON HCL 4 MG/2ML IJ SOLN
4.0000 mg | Freq: Four times a day (QID) | INTRAMUSCULAR | Status: DC | PRN
  Administered 2021-02-06: 4 mg via INTRAVENOUS

## 2021-02-06 MED ORDER — LACTATED RINGERS IV SOLN
INTRAVENOUS | Status: DC
  Administered 2021-02-06: 1000 mL via INTRAVENOUS

## 2021-02-06 MED ORDER — KETOROLAC TROMETHAMINE 30 MG/ML IJ SOLN
INTRAMUSCULAR | Status: DC | PRN
Start: 2021-02-06 — End: 2021-02-06
  Administered 2021-02-06: 30 mg via INTRAVENOUS

## 2021-02-06 MED ORDER — ONDANSETRON HCL 4 MG/2ML IJ SOLN
INTRAMUSCULAR | Status: DC | PRN
  Administered 2021-02-06: 4 mg via INTRAVENOUS

## 2021-02-06 MED ORDER — POVIDONE-IODINE 10 % EX SWAB: 2.0000 "application " | Freq: Once | CUTANEOUS | Status: DC

## 2021-02-06 SURGICAL SUPPLY — 64 items
ADH SKN CLS APL DERMABOND .7 (GAUZE/BANDAGES/DRESSINGS) ×2
APL SWBSTK 6 STRL LF DISP (MISCELLANEOUS) ×2
APPLICATOR COTTON TIP 6 STRL (MISCELLANEOUS) ×2 IMPLANT
APPLICATOR COTTON TIP 6IN STRL (MISCELLANEOUS) ×3
BAG COUNTER SPONGE SURGICOUNT (BAG) ×6 IMPLANT
BAG SPNG CNTER NS LX DISP (BAG) ×4
CABLE HIGH FREQUENCY MONO STRZ (ELECTRODE) IMPLANT
CANISTER SUCT 3000ML PPV (MISCELLANEOUS) IMPLANT
COVER BACK TABLE 60X90IN (DRAPES) ×3 IMPLANT
COVER MAYO STAND STRL (DRAPES) ×3 IMPLANT
DECANTER SPIKE VIAL GLASS SM (MISCELLANEOUS) IMPLANT
DEFOGGER SCOPE WARMER CLEARIFY (MISCELLANEOUS) ×3 IMPLANT
DERMABOND ADVANCED (GAUZE/BANDAGES/DRESSINGS) ×1
DERMABOND ADVANCED .7 DNX12 (GAUZE/BANDAGES/DRESSINGS) ×2 IMPLANT
DRAPE STERI URO 9X17 APER PCH (DRAPES) ×3 IMPLANT
DRSG OPSITE POSTOP 3X4 (GAUZE/BANDAGES/DRESSINGS) IMPLANT
DURAPREP 26ML APPLICATOR (WOUND CARE) ×3 IMPLANT
ELECT REM PT RETURN 9FT ADLT (ELECTROSURGICAL)
ELECTRODE REM PT RTRN 9FT ADLT (ELECTROSURGICAL) IMPLANT
FILTER SMOKE EVAC LAPAROSHD (FILTER) IMPLANT
GAUZE PACKING 2X5 YD STRL (GAUZE/BANDAGES/DRESSINGS) IMPLANT
GLOVE SURG ENC TEXT LTX SZ6 (GLOVE) ×3 IMPLANT
GLOVE SURG LTX SZ7 (GLOVE) ×6 IMPLANT
GLOVE SURG UNDER LTX SZ6.5 (GLOVE) ×6 IMPLANT
GLOVE SURG UNDER POLY LF SZ6.5 (GLOVE) ×3 IMPLANT
GLOVE SURG UNDER POLY LF SZ7 (GLOVE) ×15 IMPLANT
GOWN STRL REUS W/ TWL LRG LVL3 (GOWN DISPOSABLE) IMPLANT
GOWN STRL REUS W/TWL LRG LVL3 (GOWN DISPOSABLE)
KIT TURNOVER CYSTO (KITS) ×3 IMPLANT
LEGGING LITHOTOMY PAIR STRL (DRAPES) IMPLANT
NEEDLE INSUFFLATION 14GA 120MM (NEEDLE) IMPLANT
NEEDLE MAYO CATGUT SZ4 (NEEDLE) IMPLANT
NS IRRIG 1000ML POUR BTL (IV SOLUTION) ×3 IMPLANT
PACK LAVH (CUSTOM PROCEDURE TRAY) ×3 IMPLANT
PACK ROBOTIC GOWN (GOWN DISPOSABLE) ×3 IMPLANT
PACK TRENDGUARD 450 HYBRID PRO (MISCELLANEOUS) ×2 IMPLANT
PACK TRENDGUARD 600 HYBRD PROC (MISCELLANEOUS) IMPLANT
PACK VAGINAL WOMENS (CUSTOM PROCEDURE TRAY) IMPLANT
PAD OB MATERNITY 4.3X12.25 (PERSONAL CARE ITEMS) ×3 IMPLANT
PROTECTOR NERVE ULNAR (MISCELLANEOUS) IMPLANT
SEALER TISSUE G2 STRG ARTC 35C (ENDOMECHANICALS) ×3 IMPLANT
SET IRRIG TUBING LAPAROSCOPIC (IRRIGATION / IRRIGATOR) IMPLANT
SET IRRIG Y TYPE TUR BLADDER L (SET/KITS/TRAYS/PACK) ×3 IMPLANT
SET SUCTION IRRIG HYDROSURG (IRRIGATION / IRRIGATOR) ×3 IMPLANT
SET TUBE SMOKE EVAC HIGH FLOW (TUBING) ×3 IMPLANT
SHEARS HARMONIC ACE PLUS 36CM (ENDOMECHANICALS) IMPLANT
SUT VIC AB 0 CT1 18XCR BRD8 (SUTURE) ×6 IMPLANT
SUT VIC AB 0 CT1 27 (SUTURE) ×12
SUT VIC AB 0 CT1 27XBRD ANBCTR (SUTURE) ×8 IMPLANT
SUT VIC AB 0 CT1 36 (SUTURE) ×3 IMPLANT
SUT VIC AB 0 CT1 8-18 (SUTURE) ×9
SUT VIC AB 2-0 SH 27 (SUTURE) ×3
SUT VIC AB 2-0 SH 27XBRD (SUTURE) ×2 IMPLANT
SUT VICRYL 0 TIES 12 18 (SUTURE) IMPLANT
SUT VICRYL 0 UR6 27IN ABS (SUTURE) IMPLANT
SUT VICRYL 4-0 PS2 18IN ABS (SUTURE) IMPLANT
TOWEL OR 17X26 10 PK STRL BLUE (TOWEL DISPOSABLE) ×6 IMPLANT
TRAY FOLEY W/BAG SLVR 14FR (SET/KITS/TRAYS/PACK) ×3 IMPLANT
TRENDGUARD 450 HYBRID PRO PACK (MISCELLANEOUS) ×3
TRENDGUARD 600 HYBRID PROC PK (MISCELLANEOUS)
TROCAR XCEL NON-BLD 11X100MML (ENDOMECHANICALS) IMPLANT
TROCAR XCEL NON-BLD 5MMX100MML (ENDOMECHANICALS) ×9 IMPLANT
UNDERPAD 30X36 HEAVY ABSORB (UNDERPADS AND DIAPERS) ×3 IMPLANT
WARMER LAPAROSCOPE (MISCELLANEOUS) IMPLANT

## 2021-02-06 NOTE — Anesthesia Postprocedure Evaluation (Signed)
Anesthesia Post Note  Patient: Amber Mcguire  Procedure(s) Performed: LAPAROSCOPIC ASSISTED VAGINAL HYSTERECTOMY WITH SALPINGECTOMY (Bilateral: Abdomen)     Patient location during evaluation: PACU Anesthesia Type: General Level of consciousness: awake Pain management: pain level controlled Vital Signs Assessment: post-procedure vital signs reviewed and stable Respiratory status: spontaneous breathing, nonlabored ventilation, respiratory function stable and patient connected to nasal cannula oxygen Cardiovascular status: blood pressure returned to baseline and stable Postop Assessment: no apparent nausea or vomiting Anesthetic complications: no   No notable events documented.  Last Vitals:  Vitals:   02/06/21 1329 02/06/21 1722  BP: 118/79 118/81  Pulse: 85 96  Resp: 14 20  Temp: 36.6 C 37.1 C  SpO2: 100% 100%    Last Pain:  Vitals:   02/06/21 1800  TempSrc:   PainSc: 7                  Edmund Holcomb P Esaw Knippel

## 2021-02-06 NOTE — Interval H&P Note (Signed)
History and Physical Interval Note:  02/06/2021 7:13 AM  Amber Mcguire  has presented today for surgery, with the diagnosis of Fibroids.  The various methods of treatment have been discussed with the patient and family. After consideration of risks, benefits and other options for treatment, the patient has consented to  Procedure(s): HYSTERECTOMY VAGINAl WITH SALPINGECTOMY (Bilateral) POSSIBLE LAPAROSCOPIC ASSISTED VAGINAL HYSTERECTOMY WITH SALPINGECTOMY (Bilateral) as a surgical intervention.  The patient's history has been reviewed, patient examined, no change in status, stable for surgery.  I have reviewed the patient's chart and labs.  Questions were answered to the patient's satisfaction.     [name]

## 2021-02-06 NOTE — Anesthesia Procedure Notes (Signed)
Procedure Name: Intubation Date/Time: 02/06/2021 7:41 AM Performed by: Rogers Blocker, CRNA Pre-anesthesia Checklist: Patient identified, Emergency Drugs available, Suction available and Patient being monitored Patient Re-evaluated:Patient Re-evaluated prior to induction Oxygen Delivery Method: Circle System Utilized Preoxygenation: Pre-oxygenation with 100% oxygen Induction Type: IV induction Ventilation: Mask ventilation without difficulty Laryngoscope Size: Mac and 3 Grade View: Grade I Tube type: Oral Tube size: 7.0 mm Number of attempts: 1 Airway Equipment and Method: Stylet and Bite block Placement Confirmation: ETT inserted through vocal cords under direct vision, positive ETCO2 and breath sounds checked- equal and bilateral Secured at: 22 cm Tube secured with: Tape Dental Injury: Teeth and Oropharynx as per pre-operative assessment

## 2021-02-06 NOTE — Op Note (Signed)
Rikki Herne PROCEDURE DATE: 02/06/2021  PREOPERATIVE DIAGNOSIS:  Symptomatic uterine fibroids POSTOPERATIVE DIAGNOSIS:  Same SURGEON:  Dr. [name] ASSISTANT:  Dr. Harolyn Rutherford.  An experienced assistant was required given the standard of surgical care given the complexity of the case.  This assistant was needed for exposure, dissection, suctioning, retraction, instrument exchange, and for overall help during the procedure. OPERATION:  Laparoscopically assisted vaginal hysterectomy, bilateral salpingectomy, and left ovarian cystectomy.  ANESTHESIA:  General endotracheal  INDICATIONS: The patient is a 40 y.o. [id number] with history of symptomatic uterine fibroids/menorrhagia. The patient made a decision to undergo definite surgical treatment. On the preoperative visit, the risks, benefits, indications, and alternatives of the procedure were reviewed with the patient.  On the day of surgery, the risks of surgery were again discussed with the patient including but not limited to: bleeding which may require transfusion or reoperation; infection which may require antibiotics; injury to bowel, bladder, ureters or other surrounding organs; need for additional procedures; thromboembolic phenomenon, incisional problems and other postoperative/anesthesia complications. Written informed consent was obtained.    OPERATIVE FINDINGS: A 16 week size uterus with multiple fibroids with normal tubes and ovaries bilaterally with a small 2-3 cm hemorrhagic cyst on the left ovary. Normal ureteral peristalsis bilaterally.   ESTIMATED BLOOD LOSS: 400 ml FLUIDS:  1200 ml of Lactated Ringers URINE OUTPUT:  250 ml of clear yellow urine. SPECIMENS:  Uterus, cervix, bilateral tubes sent to pathology COMPLICATIONS:  None immediate.  DESCRIPTION OF PROCEDURE:  The patient received intravenous antibiotics and had sequential compression devices applied to her lower extremities while in the preoperative area.  Informed  consent was reviewed and signed in Preop area.   The patient was taken back to the OR where general anesthesia was found to be adequate. The patient was prepared and draped in the normal sterile fashion in the dorsal lithotomy position in [place]. A timeout was performed.   A foley was placed and clamped. A short weighted speculum was placed into the vagina. Cervix was grasped at 12 and 6 oclock with single tooth tenaculum and double tooth tenaculum respectively. Circumferential colpotomy was made with the Bovie. Vaginal tissue was pushed cephalad with the sponge. I entered posteriorly sharply with mayo scissors. The vagina and posterior peritoneum was tagged with 0 vicryl. Long weighted speculum was then placed in the vagina. I then entered sharply anteriorly using metzenbaum scissors and a right angle retractor. The right angle was then replaced passing it beneath my digit.   Then I bilaterally clamped the uterosacral ligaments with Roger clamps, cut and suture ligated with 0 vicryl. The cardinal ligaments were then bilaterally clamped, cut and suture ligated. Then the uterine arteries were bilaterally clamped, cut and suture ligated the uterine arteries. I then bilaterally clamped the broad ligament connecting the peritoneum anteriorly and posteriorly. The pedicle was cut and suture ligated. At this point due to the uterine fibroids, we were unable to make any additional progress due to poor descensus of the uterus. A lap sponge was placed in a glove and tied off and then placed in the vagina to help with gas seal. The foley was unclamped.  We then went above to the abdomen after changing gown and gloves. Attention turned to patient's abdomen. Skin incision made with the scalpel in the umbilicus and closed technique used for entry using the veress needle. Opening pressure was 3.  Pneumoperitoneum achieved to a pressure of 15.  0 degree laparoscope was used with the 17mm optiview port  and trocar to  enter the cavity under direct visualization. Inspection below the point of entry revealed no evidence of bowel injury. Patient placed in steep trendelenburg. Lateral 5 mm ports were placed.   The pelvis was then carefully examined.  Attention was turned to the fallopian tubes; these were freed from the underlying mesosalpinx and the uterine attachments using the Enseal device.  The bilateral round and broad ligaments were then clamped and transected with the Enseal device.  The bladder flap was already developed from our surgery below.  The ureters were noted to be safely away from the area of dissection.  The uteroovarian ligaments were cauterized and cut. We then cauterized and cut along the remaining broad ligament until we were able to connect from our surgery below. Atraumatic graspers were used to aid in moving the uterus along with a laparoscopic single tooth tenaculum. At this point all pedicles hemostatic. I went below while pressure applied on specimen from above. The foley was again clamped.   Long weighted speculum inserted along with right angle retractor. The cervix was grasped with single tooth tenaculum and double tooth. The lowest fibroid and cervix were then morcellated vaginally for about 5 total pieces and then the remaining specimen was able to be removed intact. Long speculum removed and short weighted speculum was placed. Anterior peritoneum identified and grasped with Allis clamp along with vaginal cuff. This was done posteriorly as well. Starting at each angle with 0 vicryl, sutured closed the cuff in a running locked fashion to the midline. Cuff inspected and hemostatic.   A catheter was unclamped and the urine was noted to be clear of any blood. The catheter was then removed.  I changed gloves and gown and went above to inspect the pelvis once more. All pedicles hemostatic. The ovarian cyst on the left was punctured and drained of the blood given concern that the ovary may twist  given its mobility. No bleeding noted. Lateral ports removed under direct visualization. Pneumoperitoneum reduced with valsalva breaths x3. Skin incisions closed with 4-0 vicryl and covered with dermabond.   At this point the procedure was ended. All sponge, lap, needle and instrument counts were correct x2. The patient was taken to the recovery room in good condition. Antibiotics given at the start of the procedure.  [name], MD, [place] for [facility], [place]

## 2021-02-06 NOTE — Transfer of Care (Signed)
Immediate Anesthesia Transfer of Care Note  Patient: Amber Mcguire  Procedure(s) Performed: LAPAROSCOPIC ASSISTED VAGINAL HYSTERECTOMY WITH SALPINGECTOMY (Bilateral: Abdomen)  Patient Location: PACU  Anesthesia Type:General  Level of Consciousness: awake, alert , oriented and patient cooperative  Airway & Oxygen Therapy: Patient Spontanous Breathing and Patient connected to face mask oxygen  Post-op Assessment: Report given to RN and Post -op Vital signs reviewed and stable  Post vital signs: Reviewed and stable  Last Vitals:  Vitals Value Taken Time  BP 135/85 02/06/21 1103  Temp    Pulse 81 02/06/21 1106  Resp 18 02/06/21 1106  SpO2 100 % 02/06/21 1106  Vitals shown include unvalidated device data.  Last Pain:  Vitals:   02/06/21 0600  TempSrc:   PainSc: 0-No pain      Patients Stated Pain Goal: 6 (83/38/25 0539)  Complications: No notable events documented.

## 2021-02-07 ENCOUNTER — Encounter (HOSPITAL_BASED_OUTPATIENT_CLINIC_OR_DEPARTMENT_OTHER): Payer: Self-pay | Admitting: Obstetrics and Gynecology

## 2021-02-07 DIAGNOSIS — D251 Intramural leiomyoma of uterus: Secondary | ICD-10-CM | POA: Diagnosis not present

## 2021-02-07 MED ORDER — OXYCODONE HCL 5 MG PO TABS
ORAL_TABLET | ORAL | Status: AC
  Filled 2021-02-07: qty 2

## 2021-02-07 MED ORDER — ACETAMINOPHEN 500 MG PO TABS
ORAL_TABLET | ORAL | Status: AC
  Filled 2021-02-07: qty 2

## 2021-02-07 NOTE — Discharge Summary (Signed)
OB Discharge Summary     Patient Name: Amber Mcguire DOB: [birth year] MRN: [id number]  Date of admission: 02/06/2021 Date of discharge: 02/07/2021  Admitting diagnosis: Fibroids Secondary diagnosis:  Active Problems:   Uterine fibroid  Additional problems: None     Discharge diagnosis:  Uterine fibroids s/p LAVH                                                                                                Complications: None  Hospital course:  Amber Mcguire had a LAVH, bilateral salpingectomy in the s/o uterine fibroids. Her surgery proceeded without complication. After surgery, she felt concerns about going home due to steep steps at her home and so she stayed overnight in extended recovery. Overnight, she had no acute events and had a routine postop course. She does not yet have flatus but is tolerating PO without any concern for emesis. She is ambulatory and voiding spontaneously. She has minimal bleeding. Her pain is controlled with PO medications.   Physical exam  Vitals:   02/06/21 1722 02/06/21 2203 02/07/21 0209 02/07/21 0611  BP: 118/81 118/77 118/84 112/71  Pulse: 96 85 75 67  Resp: 20 18 14 14   Temp: 98.7 F (37.1 C) 98.3 F (36.8 C) 98.6 F (37 C) 98.2 F (36.8 C)  TempSrc:      SpO2: 100% 99% 100% 100%  Weight:      Height:       General: alert, cooperative, and no distress Chest: normal respiratory effort, RRR Abdomen: s/nt/nd Incision: laparoscopic incisions c/d/I with dermabond over them DVT Evaluation: No evidence of DVT seen on physical exam.  Labs: Lab Results  Component Value Date   WBC 4.7 02/04/2021   HGB 12.4 02/04/2021   HCT 38.1 02/04/2021   MCV 79.7 (L) 02/04/2021   PLT 231 02/04/2021   CMP Latest Ref Rng & Units 12/05/2016  Glucose 65 - 99 mg/dL 106(H)  BUN 6 - 20 mg/dL 8  Creatinine 0.44 - 1.00 mg/dL 0.71  Sodium 135 - 145 mmol/L 136  Potassium 3.5 - 5.1 mmol/L 3.5  Chloride 101 - 111 mmol/L 105  CO2 22 - 32 mmol/L 24  Calcium  8.9 - 10.3 mg/dL 9.0    Discharge instruction: per After Visit Summary and "Baby and Me Booklet".  After visit meds:  Allergies as of 02/07/2021       Reactions   Shellfish Allergy Anaphylaxis, Swelling   itching        Medication List     STOP taking these medications    docusate sodium 50 MG capsule Commonly known as: COLACE   OVER THE COUNTER MEDICATION       TAKE these medications    acetaminophen 325 MG tablet Commonly known as: Tylenol Take 2 tablets (650 mg total) by mouth every 6 (six) hours as needed.   ibuprofen 600 MG tablet Commonly known as: ADVIL Take 1 tablet (600 mg total) by mouth every 6 (six) hours as needed.   oxyCODONE 5 MG immediate release tablet Commonly known as: Oxy IR/ROXICODONE Take 1 tablet (5 mg total) by  mouth every 4 (four) hours as needed for severe pain or breakthrough pain.   polyethylene glycol 17 g packet Commonly known as: MIRALAX / GLYCOLAX Take 17 g by mouth daily as needed.               Discharge Care Instructions  (From admission, onward)           Start     Ordered   02/07/21 0000  Discharge wound care:       Comments: Wounds have dermabond (like super glue) - it will come off on its own.   02/07/21 0716   02/06/21 0000  Discharge wound care:       Comments: Wound dressed with dermabond (like super glue). No care required.   02/06/21 1134   02/06/21 0000  No dressing needed        02/06/21 1134            Diet: low salt diet  Activity: Advance as tolerated. Pelvic rest for 6 weeks.   Outpatient follow up: 4-6 weeks Follow up Appt:No future appointments.   02/07/2021 [name], MD

## 2021-02-11 LAB — SURGICAL PATHOLOGY

## 2021-03-21 ENCOUNTER — Encounter: Payer: Self-pay | Admitting: Obstetrics and Gynecology

## 2021-03-21 ENCOUNTER — Ambulatory Visit (INDEPENDENT_AMBULATORY_CARE_PROVIDER_SITE_OTHER): Payer: Medicaid Other | Admitting: Obstetrics and Gynecology

## 2021-03-21 ENCOUNTER — Other Ambulatory Visit: Payer: Self-pay

## 2021-03-21 VITALS — BP 135/90 | HR 81 | Resp 16 | Ht 60.0 in | Wt 157.0 lb

## 2021-03-21 DIAGNOSIS — Z09 Encounter for follow-up examination after completed treatment for conditions other than malignant neoplasm: Secondary | ICD-10-CM

## 2021-03-21 NOTE — Progress Notes (Signed)
° °  GYNECOLOGY OFFICE VISIT NOTE  History:   Amber Mcguire is a 41 y.o. [id number] here today for postop check s/p TLH, salpingectomy and cystoscopy on 11/30.  Her surgery was uncomplicated.   Her postop recovery has been uncomplicated as well. She feels better in terms of the symptoms she was having - she feels more like herself and is glad she had the surgery.       The following portions of the patient's history were reviewed and updated as appropriate: allergies, current medications, past family history, past medical history, past social history, past surgical history and problem list.   Health Maintenance:   Diagnosis  Date Value Ref Range Status  01/02/2021   Final   - Negative for intraepithelial lesion or malignancy (NILM)     Normal mammogram on 01/09/2021.   Review of Systems:  Pertinent items noted in HPI and remainder of comprehensive ROS otherwise negative.  Physical Exam:  There were no vitals taken for this visit. CONSTITUTIONAL: Well-developed, well-nourished female in no acute distress.  HEENT:  Normocephalic, atraumatic. External right and left ear normal. No scleral icterus.  NECK: Normal range of motion, supple, no masses noted on observation SKIN: No rash noted. Not diaphoretic. No erythema. No pallor. MUSCULOSKELETAL: Normal range of motion. No edema noted. NEUROLOGIC: Alert and oriented to person, place, and time. Normal muscle tone coordination. No cranial nerve deficit noted. PSYCHIATRIC: Normal mood and affect. Normal behavior. Normal judgment and thought content.  CARDIOVASCULAR: Normal heart rate noted RESPIRATORY: Effort and breath sounds normal, no problems with respiration noted ABDOMEN: No masses noted. No other overt distention noted.  L/S incisions healed well (x3)  PELVIC: Normal appearing external genitalia; normal urethral meatus; normal appearing vaginal mucosa. Cuff is well healed.  Performed in the presence of a chaperone  Labs and  Imaging No results found for this or any previous visit (from the past 168 hour(s)). No results found.  Assessment and Plan:    1. Postop check - Healing very well. All restrictions lifted except intercourse. No intercourse for another 2 weeks until 8 wks out from surgery - Reviewed annual exam Q2 years for monitoring of ovaries and for breast exam and any other gyne concerns that may still occur. Reviewed she no longer needs pap smears as no cervix and no history of dysplasia.    Routine preventative health maintenance measures emphasized. Please refer to After Visit Summary for other counseling recommendations.   No follow-ups on file.  [name], MD, [place] for [facility], [place]

## 2021-06-20 ENCOUNTER — Encounter: Payer: Self-pay | Admitting: Obstetrics and Gynecology

## 2021-06-20 ENCOUNTER — Ambulatory Visit (INDEPENDENT_AMBULATORY_CARE_PROVIDER_SITE_OTHER): Payer: Medicaid Other | Admitting: Obstetrics and Gynecology

## 2021-06-20 VITALS — BP 147/93 | HR 71 | Resp 16 | Ht 60.0 in | Wt 154.0 lb

## 2021-06-20 DIAGNOSIS — N898 Other specified noninflammatory disorders of vagina: Secondary | ICD-10-CM | POA: Diagnosis not present

## 2021-06-20 DIAGNOSIS — M6289 Other specified disorders of muscle: Secondary | ICD-10-CM

## 2021-06-20 MED ORDER — DOXYCYCLINE HYCLATE 100 MG PO CAPS: 100.0000 mg | ORAL_CAPSULE | Freq: Two times a day (BID) | ORAL | 0 refills | Status: DC

## 2021-06-20 NOTE — Progress Notes (Signed)
? ?GYNECOLOGY OFFICE NOTE ? ?History:  ?41 y.o. [id number] here today for vaginal wall cyst. Occurred last year but had healed by the time she presented for care. Reports she feels a lump between her legs, she can touch it and feel the lump but also feels it when she is sitting. Not painful but able to express material from it. Will get worse and better.  ? ?Also having some weakness in pelvic wall, feels like things are falling. +Urinary incontinence.  ? ?Past Medical History:  ?Diagnosis Date  ? Constipation   ? History of asthma   ? none since age 15 per pt on 01-25-2021  ? History of epilepsy   ? childhood no seizures since age 36, issue resolved per pt on 01-25-2021  ? History of kidney stones   ? Uterine fibroid   ? Wears glasses 01/25/2021  ? or contacts  ? ? ?Past Surgical History:  ?Procedure Laterality Date  ? CHOLECYSTECTOMY  2004  ? [place]  ? LAPAROSCOPIC VAGINAL HYSTERECTOMY WITH SALPINGECTOMY Bilateral 02/06/2021  ? Procedure: LAPAROSCOPIC ASSISTED VAGINAL HYSTERECTOMY WITH SALPINGECTOMY;  Surgeon: [name], MD;  Location: [facility];  Service: Gynecology;  Laterality: Bilateral;  ? ? ? ?Current Outpatient Medications:  ?  acetaminophen (TYLENOL) 325 MG tablet, Take 2 tablets (650 mg total) by mouth every 6 (six) hours as needed., Disp: , Rfl:  ?  doxycycline (VIBRAMYCIN) 100 MG capsule, Take 1 capsule (100 mg total) by mouth 2 (two) times daily., Disp: 28 capsule, Rfl: 0 ?  ibuprofen (ADVIL) 600 MG tablet, Take 1 tablet (600 mg total) by mouth every 6 (six) hours as needed., Disp: 60 tablet, Rfl: 3 ?  polyethylene glycol (MIRALAX / GLYCOLAX) 17 g packet, Take 17 g by mouth daily as needed., Disp: , Rfl:  ? ?The following portions of the patient's history were reviewed and updated as appropriate: allergies, current medications, past family history, past medical history, past social history, past surgical history and problem list.  ? ?Review of  Systems:  ?Pertinent items noted in HPI and remainder of comprehensive ROS otherwise negative. ?  ?Objective:  ?Physical Exam ?BP (!) 147/93   Pulse 71   Resp 16   Ht 5' (1.524 m)   Wt 154 lb (69.9 kg)   LMP 01/13/2021   BMI 30.08 kg/m?  ?CONSTITUTIONAL: Well-developed, well-nourished female in no acute distress.  ?HENT:  Normocephalic, atraumatic. External right and left ear normal. Oropharynx is clear and moist ?EYES: Conjunctivae and EOM are normal. Pupils are equal, round, and reactive to light. No scleral icterus.  ?NECK: Normal range of motion, supple, no masses ?SKIN: Skin is warm and dry. No rash noted. Not diaphoretic. No erythema. No pallor. ?NEUROLOGIC: Alert and oriented to person, place, and time. Normal reflexes, muscle tone coordination. No cranial nerve deficit noted. ?PSYCHIATRIC: Normal mood and affect. Normal behavior. Normal judgment and thought content. ?CARDIOVASCULAR: Normal heart rate noted ?RESPIRATORY: Effort normal, no problems with respiration noted ?ABDOMEN: Soft, no distention noted.   ?PELVIC: Normal appearing external genitalia; 0.5 x 0.5 cm lump in left vaginal wall at introitus just lateral to urethral meatus, able to express large amount of purulent material from lump through what appears to be gland opening, not tender ?MUSCULOSKELETAL: Normal range of motion. No edema noted. ? ?Exam done with chaperone present. ? ?Labs and Imaging ?No results found. ? ?Assessment & Plan:  ?1. Vaginal inclusion cyst ?- Vaginal wall cyst palpated and able to  express large amount of purulent fluid from it ?- Fluid sent for culture ?- non-tender ?- will start with trial of doxycycline to clear up purulent fluid, may need excision, would consider referral to Urogyn if needs excision given proximity to urethra  ?- Wound culture ? ?2. Vaginal cyst ? ?3. Pelvic floor dysfunction ?Feels her pelvic floor is not well supported since hysterectomy ?Also with some urinary incontinence ?Referral to  PT ? ? ?Routine preventative health maintenance measures emphasized. ?Please refer to After Visit Summary for other counseling recommendations.  ? ?Return in about 4 weeks (around 07/18/2021). ? ? ?K. Arvilla Meres, MD, FACOG ?Attending ?Center for [employer]) ? ? ? ?

## 2021-06-23 LAB — WOUND CULTURE
MICRO NUMBER:: 13259715
RESULT:: NORMAL
SPECIMEN QUALITY:: ADEQUATE

## 2021-07-18 ENCOUNTER — Ambulatory Visit: Payer: Medicaid Other | Admitting: Obstetrics and Gynecology

## 2021-07-22 ENCOUNTER — Ambulatory Visit: Payer: Medicaid Other | Admitting: Obstetrics & Gynecology

## 2021-08-19 ENCOUNTER — Encounter: Payer: Self-pay | Admitting: Obstetrics & Gynecology

## 2021-08-19 ENCOUNTER — Ambulatory Visit: Payer: Medicaid Other | Admitting: Obstetrics & Gynecology

## 2021-08-19 VITALS — BP 152/95 | HR 82 | Resp 16 | Ht 60.0 in | Wt 154.0 lb

## 2021-08-19 DIAGNOSIS — I10 Essential (primary) hypertension: Secondary | ICD-10-CM | POA: Diagnosis not present

## 2021-08-19 DIAGNOSIS — S76912A Strain of unspecified muscles, fascia and tendons at thigh level, left thigh, initial encounter: Secondary | ICD-10-CM

## 2021-08-19 DIAGNOSIS — M6289 Other specified disorders of muscle: Secondary | ICD-10-CM | POA: Diagnosis not present

## 2021-08-19 DIAGNOSIS — S76919A Strain of unspecified muscles, fascia and tendons at thigh level, unspecified thigh, initial encounter: Secondary | ICD-10-CM | POA: Insufficient documentation

## 2021-08-19 DIAGNOSIS — K5900 Constipation, unspecified: Secondary | ICD-10-CM

## 2021-08-19 NOTE — Progress Notes (Signed)
   Subjective:    Patient ID: Amber Mcguire, female    DOB: Jul 10, 1980, 41 y.o.   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  Patient had a hysterectomy in November which did not relieve as much of her symptoms as she would have hoped.  She is a Art gallery manager and believes that there is something wrong with her musculature causing these issues.  She is on MiraLAX approximately once per day which is not helping as much as it used to.  She does not use a Physiological scientist.  She denies any vaginal pain or discharge.  Review of Systems  Constitutional: Negative.   Respiratory: Negative.    Cardiovascular: Negative.   Gastrointestinal:  Positive for constipation.  Genitourinary:  Positive for pelvic pain.  Musculoskeletal:        Pain in upper anterior left leg       Objective:   Physical Exam Vitals reviewed.  Constitutional:      General: She is not in acute distress.    Appearance: She is well-developed.  HENT:     Head: Normocephalic and atraumatic.  Eyes:     Conjunctiva/sclera: Conjunctivae normal.  Cardiovascular:     Rate and Rhythm: Normal rate.  Pulmonary:     Effort: Pulmonary effort is normal.  Abdominal:     General: Abdomen is flat.     Palpations: Abdomen is soft.    Genitourinary:    Comments: Tanner V Vulva:  No lesion Vagina:  Pink, no lesions, no discharge, no blood, Skene's gland normal Cervix:  Surgically absent Uterus:  Surgically absent Right adnexa--non tender, no mass Left adnexa--non tender, no mass Skin:    General: Skin is warm and dry.  Neurological:     Mental Status: She is alert and oriented to person, place, and time.  Psychiatric:        Mood and Affect: Mood normal.    Vitals:   08/19/21 1305  BP: (!) 152/95  Pulse: 82  Resp: 16  Weight: 154 lb (69.9 kg)  Height: 5' (1.524 m)   Rpt BP  144/93    Assessment & Plan:  41 year old female with constipation, pelvic floor dysfunction, iliopsoas tightness  Pelvic physical therapy order renewed. MiraLAX 1-2 times a day for constipation Squatty potty for constipation Patient has hypertension.  She has an visit with the primary care provider in 2 weeks. Patient to return as needed for treatment or recurrence of the Skene's abscess.  I provided 27 minutes of verbal and non-verbal time during this encounter date, time was needed to gather information, review chart, records, communicate/coordinate with staff, as well as complete documentation.

## 2021-08-22 ENCOUNTER — Ambulatory Visit: Payer: Medicaid Other | Attending: Obstetrics & Gynecology | Admitting: Physical Therapy

## 2021-08-22 ENCOUNTER — Encounter: Payer: Self-pay | Admitting: Physical Therapy

## 2021-08-22 DIAGNOSIS — R293 Abnormal posture: Secondary | ICD-10-CM | POA: Insufficient documentation

## 2021-08-22 DIAGNOSIS — M6281 Muscle weakness (generalized): Secondary | ICD-10-CM | POA: Insufficient documentation

## 2021-08-22 DIAGNOSIS — M6289 Other specified disorders of muscle: Secondary | ICD-10-CM | POA: Diagnosis not present

## 2021-08-22 DIAGNOSIS — M62838 Other muscle spasm: Secondary | ICD-10-CM | POA: Diagnosis present

## 2021-08-22 DIAGNOSIS — R279 Unspecified lack of coordination: Secondary | ICD-10-CM | POA: Diagnosis present
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
## 2021-08-28 ENCOUNTER — Ambulatory Visit: Payer: Medicaid Other | Admitting: Physical Therapy

## 2021-09-02 ENCOUNTER — Encounter: Payer: Self-pay | Admitting: Physical Therapy

## 2021-09-02 ENCOUNTER — Ambulatory Visit: Payer: Medicaid Other | Admitting: Physical Therapy

## 2021-09-02 DIAGNOSIS — M6281 Muscle weakness (generalized): Secondary | ICD-10-CM | POA: Diagnosis not present

## 2021-09-02 DIAGNOSIS — M62838 Other muscle spasm: Secondary | ICD-10-CM

## 2021-09-02 DIAGNOSIS — R279 Unspecified lack of coordination: Secondary | ICD-10-CM

## 2021-09-02 DIAGNOSIS — R293 Abnormal posture: Secondary | ICD-10-CM

## 2021-09-03 ENCOUNTER — Encounter: Payer: Self-pay | Admitting: Registered Nurse

## 2021-09-03 ENCOUNTER — Ambulatory Visit: Payer: Medicaid Other | Admitting: Registered Nurse

## 2021-09-03 VITALS — BP 124/76 | HR 78 | Temp 98.2°F | Resp 16 | Ht 60.0 in | Wt 160.0 lb

## 2021-09-03 DIAGNOSIS — Z0130 Encounter for examination of blood pressure without abnormal findings: Secondary | ICD-10-CM

## 2021-09-03 DIAGNOSIS — K5904 Chronic idiopathic constipation: Secondary | ICD-10-CM | POA: Diagnosis not present

## 2021-09-03 MED ORDER — LINACLOTIDE 72 MCG PO CAPS: 72.0000 ug | ORAL_CAPSULE | Freq: Every day | ORAL | 0 refills | Status: AC

## 2021-09-03 NOTE — Progress Notes (Signed)
New Patient Office Visit  Subjective:  Patient ID: Amber Mcguire, female    DOB: 18-Aug-1980  Age: 41 y.o. MRN: [id number]  CC:  Chief Complaint  Patient presents with   Establish Care    Pt here for establish, has not had a PCP for some time, had hysterectomy last year and notes some constipation since then but thinks this is improving, also concerned about her blood pressure     HPI Amber Mcguire presents to establish care  Constipation Worse since hysterectomy last year. Pressure in middle of abdomen miraLax helps but has to take this every day.  Discomfort is relieved by BM No blood or mucus in stool.   Blood Pressure Concerned about fam hx of htn No symptoms for her at this time Has not had previous dx of htn.  Outpatient Encounter Medications as of 09/03/2021  Medication Sig   linaclotide (LINZESS) 72 MCG capsule Take 1 capsule (72 mcg total) by mouth daily before breakfast.   polyethylene glycol (MIRALAX / GLYCOLAX) 17 g packet Take 17 g by mouth daily as needed.   [DISCONTINUED] acetaminophen (TYLENOL) 325 MG tablet Take 2 tablets (650 mg total) by mouth every 6 (six) hours as needed. (Patient not taking: Reported on 09/03/2021)   [DISCONTINUED] doxycycline (VIBRAMYCIN) 100 MG capsule Take 1 capsule (100 mg total) by mouth 2 (two) times daily. (Patient not taking: Reported on 08/19/2021)   [DISCONTINUED] ibuprofen (ADVIL) 600 MG tablet Take 1 tablet (600 mg total) by mouth every 6 (six) hours as needed. (Patient not taking: Reported on 09/03/2021)   No facility-administered encounter medications on file as of 09/03/2021.    Past Medical History:  Diagnosis Date   Constipation    History of asthma    none since age 81 per pt on 01-25-2021   History of epilepsy    childhood no seizures since age 58, issue resolved per pt on 01-25-2021   History of kidney stones    Uterine fibroid    Wears glasses 01/25/2021   or contacts    Past Surgical History:   Procedure Laterality Date   CHOLECYSTECTOMY  2004   [place] WITH SALPINGECTOMY Bilateral 02/06/2021   Procedure: LAPAROSCOPIC ASSISTED VAGINAL HYSTERECTOMY WITH SALPINGECTOMY;  Surgeon: [name], MD;  Location: [facility];  Service: Gynecology;  Laterality: Bilateral;    Family History  Problem Relation Age of Onset   Diabetes Maternal Grandmother    Hypertension Maternal Grandmother    Diabetes Paternal Grandmother    Cervical cancer Maternal Aunt    Anesthesia problems Neg Hx    Hypotension Neg Hx    Malignant hyperthermia Neg Hx    Pseudochol deficiency Neg Hx     Social History   Socioeconomic History   Marital status: Divorced    Spouse name: Not on file   Number of children: Not on file   Years of education: Not on file   Highest education level: Not on file  Occupational History   Occupation: Yoga Instructor  Tobacco Use   Smoking status: Former    Types: Cigarettes   Smokeless tobacco: Never  Vaping Use   Vaping Use: Some days   Substances: THC, CBD  Substance and Sexual Activity   Alcohol use: Not Currently   Drug use: Yes    Types: Marijuana    Comment: edibles and smoking marijuana last used 01-25-2021   Sexual activity: Not Currently    Birth  control/protection: Surgical  Other Topics Concern   Not on file  Social History Narrative   Not on file   Social Determinants of Health   Financial Resource Strain: Not on file  Food Insecurity: Not on file  Transportation Needs: Not on file  Physical Activity: Not on file  Stress: Not on file  Social Connections: Not on file  Intimate Partner Violence: Not on file    ROS Review of Systems Per hpi   Objective:   Today's Vitals: BP 124/76   Pulse 78   Temp 98.2 F (36.8 C) (Temporal)   Resp 16   Ht 5' (1.524 m)   Wt 160 lb (72.6 kg)   LMP 01/13/2021   SpO2 98%   BMI 31.25 kg/m   Physical  Exam Vitals and nursing note reviewed.  Constitutional:      General: She is not in acute distress.    Appearance: Normal appearance. She is normal weight. She is not ill-appearing, toxic-appearing or diaphoretic.  Cardiovascular:     Rate and Rhythm: Normal rate and regular rhythm.     Heart sounds: Normal heart sounds. No murmur heard.    No friction rub. No gallop.  Pulmonary:     Effort: Pulmonary effort is normal. No respiratory distress.     Breath sounds: Normal breath sounds. No stridor. No wheezing, rhonchi or rales.  Chest:     Chest wall: No tenderness.  Abdominal:     General: Abdomen is flat. Bowel sounds are normal. There is no distension.     Palpations: Abdomen is soft. There is no mass.     Tenderness: There is no abdominal tenderness. There is no guarding.  Skin:    General: Skin is warm and dry.  Neurological:     General: No focal deficit present.     Mental Status: She is alert and oriented to person, place, and time. Mental status is at baseline.  Psychiatric:        Mood and Affect: Mood normal.        Behavior: Behavior normal.        Thought Content: Thought content normal.        Judgment: Judgment normal.         Assessment & Plan:   Problem List Items Addressed This Visit       Other   Constipation - Primary    Suspect IBS-C given lack of red flags and abd pain relieved by BM. Will start on low dose of linzess and monitor effects in coming weeks. Low threshold for imaging and/or GI referral if symptoms persist, worsen, change, or red flags arise.       Relevant Medications   linaclotide (LINZESS) 72 MCG capsule   Blood pressure check    BP wnl. No need for management at this time. Reviewed healthy habits with patient.        Follow-up: Return in about 6 months (around 03/05/2022) for CPE and labs.   Maximiano Coss, NP

## 2021-09-04 DIAGNOSIS — Z0130 Encounter for examination of blood pressure without abnormal findings: Secondary | ICD-10-CM | POA: Insufficient documentation

## 2021-09-04 NOTE — Assessment & Plan Note (Signed)
Suspect IBS-C given lack of red flags and abd pain relieved by BM. Will start on low dose of linzess and monitor effects in coming weeks. Low threshold for imaging and/or GI referral if symptoms persist, worsen, change, or red flags arise.

## 2021-09-04 NOTE — Assessment & Plan Note (Signed)
BP wnl. No need for management at this time. Reviewed healthy habits with patient.

## 2021-09-12 ENCOUNTER — Ambulatory Visit: Payer: Medicaid Other | Attending: Obstetrics & Gynecology | Admitting: Physical Therapy

## 2021-09-12 DIAGNOSIS — R279 Unspecified lack of coordination: Secondary | ICD-10-CM | POA: Diagnosis present

## 2021-09-12 DIAGNOSIS — M6281 Muscle weakness (generalized): Secondary | ICD-10-CM | POA: Insufficient documentation

## 2021-09-12 DIAGNOSIS — R293 Abnormal posture: Secondary | ICD-10-CM | POA: Insufficient documentation

## 2021-09-12 DIAGNOSIS — M62838 Other muscle spasm: Secondary | ICD-10-CM | POA: Insufficient documentation
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
## 2021-09-12 NOTE — Patient Instructions (Signed)

## 2021-09-18 ENCOUNTER — Ambulatory Visit: Payer: Medicaid Other | Admitting: Physical Therapy

## 2021-09-18 ENCOUNTER — Telehealth: Payer: Self-pay | Admitting: Physical Therapy

## 2021-09-18 NOTE — Telephone Encounter (Signed)
PT called pt about this morning's appointment at 1100. Pt did not answer, voicemail left.   Stacy Gardner, PT, DPT 09/18/2309:35 AM

## 2021-09-25 ENCOUNTER — Telehealth: Payer: Self-pay | Admitting: Physical Therapy

## 2021-09-25 ENCOUNTER — Ambulatory Visit: Payer: Medicaid Other | Admitting: Physical Therapy

## 2021-09-25 NOTE — Telephone Encounter (Signed)
PT called pt about today's appointment at 1100. Pt did not answer, voicemail left.    This is pt's second missed appointment.   Stacy Gardner, PT, DPT 07/19/234:57 PM

## 2021-10-02 ENCOUNTER — Ambulatory Visit: Payer: Medicaid Other | Admitting: Physical Therapy

## 2021-10-02 DIAGNOSIS — M6281 Muscle weakness (generalized): Secondary | ICD-10-CM | POA: Diagnosis not present

## 2021-10-02 DIAGNOSIS — R279 Unspecified lack of coordination: Secondary | ICD-10-CM

## 2021-10-02 DIAGNOSIS — R293 Abnormal posture: Secondary | ICD-10-CM
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
## 2021-10-09 ENCOUNTER — Ambulatory Visit: Payer: Medicaid Other | Admitting: Physical Therapy

## 2021-10-16 ENCOUNTER — Ambulatory Visit: Payer: Medicaid Other | Attending: Obstetrics & Gynecology | Admitting: Physical Therapy

## 2021-10-16 DIAGNOSIS — M6281 Muscle weakness (generalized): Secondary | ICD-10-CM | POA: Insufficient documentation

## 2021-10-16 DIAGNOSIS — R293 Abnormal posture: Secondary | ICD-10-CM | POA: Insufficient documentation

## 2021-10-16 DIAGNOSIS — R279 Unspecified lack of coordination: Secondary | ICD-10-CM | POA: Diagnosis present
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
## 2022-03-12 ENCOUNTER — Encounter: Payer: Medicaid Other | Admitting: Registered Nurse

## 2023-02-27 ENCOUNTER — Telehealth: Payer: Self-pay | Admitting: *Deleted

## 2023-02-27 NOTE — Telephone Encounter (Signed)
Left patient a message to call and schedule. 

## 2023-05-18 ENCOUNTER — Ambulatory Visit
Payer: No Typology Code available for payment source | Admitting: Student in an Organized Health Care Education/Training Program

## 2023-05-20 ENCOUNTER — Ambulatory Visit: Admitting: Student in an Organized Health Care Education/Training Program

## 2023-05-25 ENCOUNTER — Ambulatory Visit: Admitting: Student in an Organized Health Care Education/Training Program
